# Patient Record
Sex: Male | Born: 1990 | Race: Black or African American | Hispanic: No | Marital: Single | Smoking: Current every day smoker
Health system: Southern US, Community
[De-identification: ages and names within clinical notes are randomized; demographics above are authoritative.]

## PROBLEM LIST (undated history)

## (undated) HISTORY — PX: ELBOW SURGERY: SHX618

---
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
## 2008-10-01 ENCOUNTER — Emergency Department (HOSPITAL_COMMUNITY): Admission: EM | Admit: 2008-10-01 | Discharge: 2008-10-01 | Payer: Self-pay | Admitting: Emergency Medicine

## 2008-10-10 ENCOUNTER — Emergency Department (HOSPITAL_COMMUNITY): Admission: EM | Admit: 2008-10-10 | Discharge: 2008-10-10 | Payer: Self-pay | Admitting: Emergency Medicine

## 2009-10-14 ENCOUNTER — Emergency Department (HOSPITAL_COMMUNITY): Admission: EM | Admit: 2009-10-14 | Discharge: 2009-10-14 | Payer: Self-pay | Admitting: Emergency Medicine

## 2009-10-21 ENCOUNTER — Ambulatory Visit (HOSPITAL_COMMUNITY): Admission: RE | Admit: 2009-10-21 | Discharge: 2009-10-21 | Payer: Self-pay | Admitting: Orthopedic Surgery

## 2009-11-13 ENCOUNTER — Encounter: Admission: RE | Admit: 2009-11-13 | Discharge: 2010-01-20 | Payer: Self-pay | Admitting: Orthopedic Surgery

## 2010-09-23 LAB — CBC
HCT: 43.8 % (ref 39.0–52.0)
Hemoglobin: 15 g/dL (ref 13.0–17.0)
MCHC: 34.2 g/dL (ref 30.0–36.0)
MCV: 93.7 fL (ref 78.0–100.0)
Platelets: 278 10*3/uL (ref 150–400)
RBC: 4.68 MIL/uL (ref 4.22–5.81)
RDW: 13.2 % (ref 11.5–15.5)
WBC: 9.4 10*3/uL (ref 4.0–10.5)

## 2010-09-23 LAB — BASIC METABOLIC PANEL
BUN: 12 mg/dL (ref 6–23)
CO2: 25 mEq/L (ref 19–32)
Calcium: 9.4 mg/dL (ref 8.4–10.5)
Chloride: 105 mEq/L (ref 96–112)
Creatinine, Ser: 1 mg/dL (ref 0.4–1.5)
GFR calc Af Amer: >60 mL/min (ref >60)
GFR calc non Af Amer: >60 mL/min (ref >60)
Glucose, Bld: 105 mg/dL — ABNORMAL HIGH (ref 70–99)
Potassium: 4.1 mEq/L (ref 3.5–5.1)
Sodium: 135 mEq/L (ref 135–145)

## 2011-09-20 ENCOUNTER — Emergency Department (HOSPITAL_COMMUNITY)
Admission: EM | Admit: 2011-09-20 | Discharge: 2011-09-20 | Disposition: A | Discharge status: Home / Self Care | Payer: Self-pay | Attending: Emergency Medicine | Admitting: Emergency Medicine

## 2011-09-20 ENCOUNTER — Emergency Department (HOSPITAL_COMMUNITY): Payer: Self-pay

## 2011-09-20 ENCOUNTER — Encounter (HOSPITAL_COMMUNITY): Payer: Self-pay | Admitting: *Deleted

## 2011-09-20 DIAGNOSIS — IMO0002 Reserved for concepts with insufficient information to code with codable children: Secondary | ICD-10-CM | POA: Insufficient documentation

## 2011-09-20 DIAGNOSIS — M25569 Pain in unspecified knee: Secondary | ICD-10-CM | POA: Insufficient documentation

## 2011-09-20 DIAGNOSIS — X500XXA Overexertion from strenuous movement or load, initial encounter: Secondary | ICD-10-CM | POA: Insufficient documentation

## 2011-09-20 DIAGNOSIS — S8390XA Sprain of unspecified site of unspecified knee, initial encounter: Secondary | ICD-10-CM

## 2011-09-20 MED ORDER — HYDROCODONE-ACETAMINOPHEN 5-325 MG PO TABS: 1.0000 | ORAL_TABLET | ORAL | Status: AC | PRN

## 2011-09-20 NOTE — Discharge Instructions (Signed)
Your x-rays do not show any broken bones today. At this time your providers are concerned for a knee sprain with possible ligament injury. You have been given crutches and a brace to where to help with your pain and swelling and help with healing. You should use rest, ice, compression and elevation. Please followup with orthopedic specialist next week for continued evaluation and treatment. You may also followup with your primary care provider.   Knee Sprain You have a knee sprain. Sprains are painful injuries to the joints. A sprain is a partial or complete tearing of ligaments. Ligaments are tough, fibrous tissues that hold bones together at the joints. A strain (sprain) has occurred when a ligament is stretched or damaged. This injury may take several weeks to heal. This is often the same length of time as a bone fracture (break in bone) takes to heal. Even though a fracture (bone break) may not have occurred, the recovery times may be similar. HOME CARE INSTRUCTIONS   Rest the injured area for as long as directed by your caregiver. Then slowly start using the joint as directed by your caregiver and as the pain allows. Use crutches as directed. If the knee was splinted or casted, continue use and care as directed. If an ace bandage has been applied today, it should be removed and reapplied every 3 to 4 hours. It should not be applied tightly, but firmly enough to keep swelling down. Watch toes and feet for swelling, bluish discoloration, coldness, numbness or excessive pain. If any of these symptoms occur, remove the ace bandage and reapply more loosely.If these symptoms persist, seek medical attention.   For the first 24 hours, lie down. Keep the injured extremity elevated on two pillows.   Apply ice to the injured area for 15 to 20 minutes every couple hours. Repeat this 3 to 4 times per day for the first 48 hours. Put the ice in a plastic bag and place a towel between the bag of ice and your skin.     Wear any splinting, casting, or elastic bandage applications as instructed.   Only take over-the-counter or prescription medicines for pain, discomfort, or fever as directed by your caregiver. Do not use aspirin immediately after the injury unless instructed by your caregiver. Aspirin can cause increased bleeding and bruising of the tissues.   If you were given crutches, continue to use them as instructed. Do not resume weight bearing on the affected extremity until instructed.  Persistent pain and inability to use the injured area as directed for more than 2 to 3 days are warning signs. If this happens you should see a caregiver for a follow-up visit as soon as possible. Initially, a hairline fracture (this is the same as a broken bone) may not be evident on x-rays. Persistent pain and swelling indicate that further evaluation, non-weight bearing (use of crutches as instructed), and/or further x-rays are indicated. X-rays may sometimes not show a small fracture until a week or ten days later. Make a follow-up appointment with your own caregiver or one to whom we have referred you. A radiologist (specialist in reading x-rays) may re-read your X-rays. Make sure you know how you are to get your x-ray results. Do not assume everything is normal if you do not hear from [place]. SEEK MEDICAL CARE IF:   Bruising, swelling, or pain increases.   You have cold or numb toes   You have continuing difficulty or pain with walking.  SEEK IMMEDIATE MEDICAL  CARE IF:   Your toes are cold, numb or blue.   The pain is not responding to medications and continues to stay the same or get worse.  MAKE SURE YOU:   Understand these instructions.   Will watch your condition.   Will get help right away if you are not doing well or get worse.  Document Released: 06/22/2005 Document Revised: 06/11/2011 Document Reviewed: 06/06/2007 [facility] Patient Information 2012 [place], [place].    RESOURCE GUIDE  Dental  Problems  Patients with Medicaid: [facility] [phone] [address]                                           [phone] [employer] Phone:  [phone]                                                  Phone:  [phone]  If unable to pay or uninsured, contact:  [facility] or [facility]. to become qualified for the adult dental clinic.  Chronic Pain Problems Contact [facility]  [phone] Patients need to be referred by their primary care doctor.  Insufficient Money for Medicine Contact [employer] Way:  call "211" or [facility] 873-546-1560.  No Primary Care Doctor Call [facility]  351-841-2529 Other agencies that provide inexpensive medical care    [facility]  [phone]    [facility]  [phone]    [facility]  [phone]    [facility]  [phone]    [facility]  787-724-8506    [facility]  [phone]  Psychological Services [facility] Behavioral [facility]  [phone] [facility] Services  [phone] [facility] Mental Health   [phone] (emergency services [phone])  Substance Abuse Resources [facility]  [phone] [facility] [phone] [facility] [place] 331-552-7530 Floydene Flock ([phone] Residential & Outpatient Substance Abuse Program  [phone]  Abuse/Neglect [facility] Child Abuse Hotline (801)002-3091 [facility] Child Abuse Hotline 575-353-9814 (After Hours)  Emergency Shelter [facility] (319) 643-9861  Maternity Homes Room at the [place] [phone] [place] Alert Services 7805486023  MRSA Hotline #:   563 093 9133    [facility] Resources  [facility]     [employer]                          [facility] Dept. [address] [place]                       [address]      [address]    Patrecia Pace  [facility] Phone:  [phone]                                   Phone:  [phone]                 Phone:  [place] Phone:  [place] [phone] (After Hours)

## 2011-09-20 NOTE — ED Notes (Signed)
C/o right knee pain after playing basketball last night
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
## 2011-09-20 NOTE — ED Notes (Signed)
Pt reports acute onset of rt knee pain s/p playing basketball yesterday evening. Pt w/o any gross deformities at present. Pt in no acute distress

## 2011-09-20 NOTE — ED Notes (Signed)
[employer] paged.

## 2011-09-20 NOTE — ED Notes (Signed)
Rx given x1 D/c instructions reviewed w/ pt - pt denies any further questions or concerns at present.   

## 2011-09-22 NOTE — ED Provider Notes (Signed)
Medical screening examination/treatment/procedure(s) were performed by non-physician practitioner and as supervising physician I was immediately available for consultation/collaboration.  Umaiza Matusik Smitty Cords, MD 09/22/11 0200

## 2012-08-24 ENCOUNTER — Emergency Department (HOSPITAL_COMMUNITY): Payer: Self-pay

## 2012-08-24 ENCOUNTER — Encounter (HOSPITAL_COMMUNITY): Payer: Self-pay | Admitting: Emergency Medicine

## 2012-08-24 ENCOUNTER — Emergency Department (HOSPITAL_COMMUNITY)
Admission: EM | Admit: 2012-08-24 | Discharge: 2012-08-24 | Disposition: A | Discharge status: Home / Self Care | Payer: Self-pay | Attending: Emergency Medicine | Admitting: Emergency Medicine

## 2012-08-24 DIAGNOSIS — Z791 Long term (current) use of non-steroidal anti-inflammatories (NSAID): Secondary | ICD-10-CM | POA: Insufficient documentation

## 2012-08-24 DIAGNOSIS — IMO0002 Reserved for concepts with insufficient information to code with codable children: Secondary | ICD-10-CM | POA: Insufficient documentation

## 2012-08-24 DIAGNOSIS — Y9367 Activity, basketball: Secondary | ICD-10-CM | POA: Insufficient documentation

## 2012-08-24 DIAGNOSIS — Y9239 Other specified sports and athletic area as the place of occurrence of the external cause: Secondary | ICD-10-CM | POA: Insufficient documentation

## 2012-08-24 DIAGNOSIS — S86911A Strain of unspecified muscle(s) and tendon(s) at lower leg level, right leg, initial encounter: Secondary | ICD-10-CM

## 2012-08-24 DIAGNOSIS — R296 Repeated falls: Secondary | ICD-10-CM | POA: Insufficient documentation

## 2012-08-24 DIAGNOSIS — Z9889 Other specified postprocedural states: Secondary | ICD-10-CM | POA: Insufficient documentation

## 2012-08-24 DIAGNOSIS — Z87828 Personal history of other (healed) physical injury and trauma: Secondary | ICD-10-CM | POA: Insufficient documentation

## 2012-08-24 MED ORDER — KETOROLAC TROMETHAMINE 10 MG PO TABS
10.0000 mg | ORAL_TABLET | ORAL | Status: AC
  Administered 2012-08-24: 10 mg via ORAL
  Filled 2012-08-24: qty 1

## 2012-08-24 MED ORDER — DICLOFENAC SODIUM 75 MG PO TBEC: 75.0000 mg | DELAYED_RELEASE_TABLET | Freq: Two times a day (BID) | ORAL | Status: AC

## 2012-08-24 NOTE — ED Provider Notes (Signed)
History  This chart was scribed for non-physician practitioner working with [name], MD by Ardeen Jourdain, ED Scribe. This patient was seen in room TR09C/TR09C and the patient's care was started at 2018.  CSN: [id number]  Arrival date & time 08/24/12  Troy Dickerson   First MD Initiated Contact with Patient 08/24/12 2018      Chief Complaint  Patient presents with  . Knee Pain    Patient is a 22 y.o. male presenting with knee pain. The history is provided by the patient. No language interpreter was used.  Knee Pain Location:  Knee Injury: yes   Mechanism of injury: fall   Fall:    Fall occurred:  Recreating/playing   Impact surface:  [employer]ed forces training and education officer of impact:  Feet   Entrapped after fall: no   Knee location:  R knee Pain details:    Quality:  Sharp and throbbing   Radiates to:  Does not radiate   Severity:  Moderate   Onset quality:  Sudden   Timing:  Constant   Progression:  Worsening Chronicity:  Recurrent Dislocation: no   Foreign body present:  No foreign bodies Prior injury to area:  Yes Relieved by:  None tried Worsened by:  Bearing weight, adduction and abduction Ineffective treatments:  None tried Associated symptoms: no back pain, no decreased ROM, no fatigue, no fever, no itching, no muscle weakness, no neck pain, no numbness, no stiffness, no swelling and no tingling     Troy Dickerson is a 22 y.o. male who presents to the Emergency Department complaining of right knee pain that began this afternoon while playing basketball. He states he "came down on it wrong." He is unable to ambulate due to the pain. He states he has a previous injury to the area, but is not sure what the injury is. He denies any surgery to the area. He denies any pertinent or chronic medical conditions.    History reviewed. No pertinent past medical history.  Past Surgical History  Procedure Laterality Date  . Elbow surgery      No family history on file.  History   Substance Use Topics  . Smoking status: Never Smoker   . Smokeless tobacco: Not on file  . Alcohol Use: No      Review of Systems  Constitutional: Negative for fever and fatigue.  HENT: Negative for neck pain.   Musculoskeletal: Negative for back pain and stiffness.       Right knee pain  Skin: Negative for itching.  All other systems reviewed and are negative.    Allergies  Review of patient's allergies indicates no known allergies.  Home Medications   Current Outpatient Rx  Name  Route  Sig  Dispense  Refill  . naproxen sodium (ANAPROX) 220 MG tablet   Oral   Take 440 mg by mouth 2 (two) times daily with a meal.           Triage Vitals: BP 134/77  Pulse 112  Temp(Src) 98.2 F (36.8 C) (Oral)  Resp 20  SpO2 97%  Physical Exam  Nursing note and vitals reviewed. Constitutional: He is oriented to person, place, and time. He appears well-developed and well-nourished. No distress.  HENT:  Head: Normocephalic and atraumatic.  Eyes: Conjunctivae and EOM are normal. Pupils are equal, round, and reactive to light.  Neck: Normal range of motion. Neck supple. No tracheal deviation present.  Cardiovascular: Normal rate.   Pulmonary/Chest: Effort normal. No respiratory distress.  Abdominal: Soft. He exhibits no distension.  Musculoskeletal: Normal range of motion. He exhibits tenderness. He exhibits no edema.  Full ROM of right hip, right knee warm but not hot, pain with attempted ROM of right knee, no effusion present, patella midline, no posterior mass, no deformity of the tib/fib area, achilles inact  Neurological: He is alert and oriented to person, place, and time.  Skin: Skin is warm and dry.  Psychiatric: He has a normal mood and affect. His behavior is normal.    ED Course  Procedures (including critical care time)  DIAGNOSTIC STUDIES: Oxygen Saturation is 97% on room air, normal by my interpretation.    COORDINATION OF CARE:  9:08 PM: Discussed  treatment plan which includes x-ray of the right knee, a knee immobilizer and pain medication with pt at bedside and pt agreed to plan.     Labs Reviewed - No data to display Dg Knee Complete 4 Views Right  08/24/2012  *RADIOLOGY REPORT*  Clinical Data: Right knee pain.  RIGHT KNEE - COMPLETE 4+ VIEW  Comparison: 09/20/2011.  Findings: There are mild stable degenerative changes mainly involving the lateral compartment.  No acute bony findings, osteochondral lesion or definite joint effusion.  IMPRESSION: Degenerative changes but no acute bony findings.   Original Report Authenticated By: Rudie Meyer, M.D.      No diagnosis found.    MDM  I have reviewed nursing notes, vital signs, and all appropriate lab and imaging results for this patient. Patient states he's been having problems with his right knee for quite some time. He has not been taken to an orthopedist to have this evaluated. Today he was playing basketball and came down on his right leg the wrong way and felt severe pain. The patient states he has had pain when attempting to walk on the leg since that time. X-ray of the right knee reveals degenerative changes being present but no fracture or dislocation or effusion.  The plan at this time is for the patient be fitted with a knee immobilizer and crutches. Patient advised to use ice and elevation for now. He is referred to Dr. Carola Frost for additional evaluation and management of this problem. Prescription for diclofenac 2 times daily given to the patient.       Kathie Dike, [place] 08/24/12 2154

## 2012-08-24 NOTE — Progress Notes (Signed)
Orthopedic Tech Progress Note Patient Details:  Troy Dickerson 1991/05/28 [id number]  Ortho Devices Type of Ortho Device: Crutches Ortho Device/Splint Location: right leg Ortho Device/Splint Interventions: Application   Nikki Dom 08/24/2012, 9:51 PM

## 2012-08-24 NOTE — Progress Notes (Signed)
Orthopedic Tech Progress Note Patient Details:  Troy Dickerson 28-Jan-1991 [id number]  Ortho Devices Type of Ortho Device: Knee Immobilizer Ortho Device/Splint Location: right leg Ortho Device/Splint Interventions: Application   Nikki Dom 08/24/2012, 9:32 PM

## 2012-08-24 NOTE — ED Notes (Signed)
Pt st's he injured right knee playing basketball earlier today.  Unable to ambulate due to pain

## 2012-08-25 NOTE — ED Provider Notes (Signed)
Medical screening examination/treatment/procedure(s) were performed by non-physician practitioner and as supervising physician I was immediately available for consultation/collaboration.   [name], MD 08/25/12 [phone]

## 2015-07-23 ENCOUNTER — Emergency Department (HOSPITAL_COMMUNITY)
Admission: EM | Admit: 2015-07-23 | Discharge: 2015-07-23 | Disposition: A | Discharge status: Home / Self Care | Payer: Self-pay | Attending: Emergency Medicine | Admitting: Emergency Medicine

## 2015-07-23 ENCOUNTER — Encounter (HOSPITAL_COMMUNITY): Payer: Self-pay | Admitting: Emergency Medicine

## 2015-07-23 DIAGNOSIS — F17200 Nicotine dependence, unspecified, uncomplicated: Secondary | ICD-10-CM | POA: Insufficient documentation

## 2015-07-23 DIAGNOSIS — Z79899 Other long term (current) drug therapy: Secondary | ICD-10-CM | POA: Insufficient documentation

## 2015-07-23 DIAGNOSIS — Z791 Long term (current) use of non-steroidal anti-inflammatories (NSAID): Secondary | ICD-10-CM | POA: Insufficient documentation

## 2015-07-23 DIAGNOSIS — L720 Epidermal cyst: Secondary | ICD-10-CM | POA: Insufficient documentation

## 2015-07-23 MED ORDER — LIDOCAINE HCL (PF) 1 % IJ SOLN
5.0000 mL | Freq: Once | INTRAMUSCULAR | Status: AC
  Administered 2015-07-23: 5 mL
  Filled 2015-07-23: qty 5

## 2015-07-23 NOTE — ED Notes (Signed)
Pt sts abscess on back of his neck x 3 days that is painful

## 2015-07-23 NOTE — ED Provider Notes (Signed)
CSN: [id number]     Arrival date & time 07/23/15  1236 History  By signing my name below, I, Troy Dickerson, attest that this documentation has been prepared under the direction and in the presence of Kerrie Buffalo, NP  Electronically Signed: Jarvis Dickerson, ED Scribe. 07/24/2015. 12:13 AM.    Chief Complaint  Patient presents with  . Abscess   Patient is a 25 y.o. male presenting with abscess. The history is provided by the patient. No language interpreter was used.  Abscess Location:  Head/neck Size:  5 cm Abscess quality: painful and redness   Abscess quality: not draining   Red streaking: no   Duration:  3 days Progression:  Unchanged Chronicity:  New Context: not diabetes and not immunosuppression   Relieved by:  None tried Worsened by:  Nothing tried Ineffective treatments:  None tried Associated symptoms: no fever, no headaches, no nausea and no vomiting   Risk factors: no hx of MRSA and no prior abscess     HPI Comments: Troy Dickerson is a 25 y.o. male who presents to the Emergency Department complaining of a large, painful, swollen area to the back of his neck for 3 days. Pt states movement of his neck exacerbates the pain.  He has not tried any treatments prior to arrival. He denies any h/o abscesses in the past. He denies any drainage from the area. Pt denies any fever, chills, or other associated symptoms.    History reviewed. No pertinent past medical history. Past Surgical History  Procedure Laterality Date  . Elbow surgery     History reviewed. No pertinent family history. Social History  Substance Use Topics  . Smoking status: Current Every Day Smoker  . Smokeless tobacco: None  . Alcohol Use: No    Review of Systems  Constitutional: Negative for fever.  Gastrointestinal: Negative for nausea and vomiting.  Skin:       Abscess to back of neck  Neurological: Negative for headaches.  All other systems reviewed and are negative.     Allergies   Review of patient's allergies indicates no known allergies.  Home Medications   Prior to Admission medications   Medication Sig Start Date End Date Taking? Authorizing Provider  diclofenac (VOLTAREN) 75 MG EC tablet Take 1 tablet (75 mg total) by mouth 2 (two) times daily. 08/24/12   Ivery Quale, PA-C  naproxen sodium (ANAPROX) 220 MG tablet Take 440 mg by mouth 2 (two) times daily with a meal.    Historical Provider, MD   BP 162/76 mmHg  Pulse 83  Temp(Src) 98.4 F (36.9 C) (Oral)  Resp 20  SpO2 98% Physical Exam  Constitutional: He is oriented to person, place, and time. He appears well-developed and well-nourished.  Eyes: EOM are normal.  Neck: Neck supple.  Pulmonary/Chest: Effort normal.  Abdominal: Soft. There is no tenderness.  Musculoskeletal: Normal range of motion.  Neurological: He is alert and oriented to person, place, and time. No cranial nerve deficit.  Skin: Skin is warm and dry.  5cm area to posterior aspect of the neck  Nursing note and vitals reviewed.   ED Course  Procedures (including critical care time)  DIAGNOSTIC STUDIES: Oxygen Saturation is 98% on RA, normal by my interpretation.    COORDINATION OF CARE: 2:07 PM- Will consult with Dr. Clydene Pugh due to location or abscess to determine if I&D or referral is appropriate.  Pt advised of plan for treatment and pt agrees.  2:15 pm - INCISION AND DRAINAGE  PROCEDURE NOTE: Patient identification was confirmed and verbal consent was obtained. This procedure was performed by Kerrie Buffalo, NP at 2:15 pm. Site: posterior aspect of neck Sterile procedures observed Needle size: 25 Anesthetic used (type and amt): 3cc of 1% lido w/o epi Blade size: 11 Drainage: large amount of mucus fluid Complexity: Complex Packing used: none Site anesthetized, incision made over site, wound drained and explored loculations, rinsed with copious amounts of normal saline,covered with dry, sterile dressing.  Pt tolerated  procedure well without complications.  Instructions for care discussed verbally and pt provided with additional written instructions for homecare and f/u.   MDM  25 y.o. male with tender swollen area to the posterior aspect of the neck stable for d/c without fever or meningeal signs and does not appear toxic. Discussed with the patient and all questioned fully answered. He will return if any problems arise. Referral to general surgery given to the patient if symptoms return.   Final diagnoses:  Epidermal cyst of neck   I personally performed the services described in this documentation, which was scribed in my presence. The recorded information has been reviewed and is accurate.     [address] [place], NP 07/24/15 1914  Lyndal Pulley, MD 07/24/15 [phone]

## 2015-07-23 NOTE — ED Notes (Signed)
NP at bedside.

## 2015-07-23 NOTE — Discharge Instructions (Signed)
The type of cyst you have may return even after we drained it today. I am giving you the name of a [facility] to follow up with if it the fluid fills back up in the area. They may need to do more extensive surgical procedure so the area will not re occur.

## 2017-11-18 ENCOUNTER — Encounter (HOSPITAL_COMMUNITY): Payer: Self-pay | Admitting: Emergency Medicine

## 2017-11-18 ENCOUNTER — Other Ambulatory Visit: Payer: Self-pay

## 2017-11-18 ENCOUNTER — Ambulatory Visit (HOSPITAL_COMMUNITY)
Admission: EM | Admit: 2017-11-18 | Discharge: 2017-11-18 | Disposition: A | Discharge status: Home / Self Care | Payer: Self-pay | Attending: Family Medicine | Admitting: Family Medicine

## 2017-11-18 DIAGNOSIS — Z113 Encounter for screening for infections with a predominantly sexual mode of transmission: Secondary | ICD-10-CM

## 2017-11-18 DIAGNOSIS — Z79899 Other long term (current) drug therapy: Secondary | ICD-10-CM | POA: Insufficient documentation

## 2017-11-18 DIAGNOSIS — F17210 Nicotine dependence, cigarettes, uncomplicated: Secondary | ICD-10-CM | POA: Insufficient documentation

## 2017-11-18 DIAGNOSIS — Z202 Contact with and (suspected) exposure to infections with a predominantly sexual mode of transmission: Secondary | ICD-10-CM | POA: Insufficient documentation

## 2017-11-18 MED ORDER — CEFTRIAXONE SODIUM 250 MG IJ SOLR
INTRAMUSCULAR | Status: AC
  Filled 2017-11-18: qty 250

## 2017-11-18 MED ORDER — CEFTRIAXONE SODIUM 250 MG IJ SOLR
250.0000 mg | Freq: Once | INTRAMUSCULAR | Status: AC
  Administered 2017-11-18: 250 mg via INTRAMUSCULAR

## 2017-11-18 MED ORDER — AZITHROMYCIN 250 MG PO TABS
ORAL_TABLET | ORAL | Status: AC
  Filled 2017-11-18: qty 4

## 2017-11-18 MED ORDER — AZITHROMYCIN 250 MG PO TABS
1000.0000 mg | ORAL_TABLET | Freq: Once | ORAL | Status: AC
  Administered 2017-11-18: 1000 mg via ORAL

## 2017-11-18 NOTE — ED Provider Notes (Signed)
[facility]   [id number] 11/18/17 Arrival Time: 1533   SUBJECTIVE:  Troy Dickerson is a 27 y.o. male who presents requesting STI screening.  Last unprotected sexual encounter 2 weeks ago.  Patient states girlfriend tested positive for chlamydia 2 days ago.  Sexually active with 2 partners females.  Currently asymptomatic.  Denies fever, chills, nausea, vomiting, abdominal pain, urinary symptoms, penile discharge, lesions or rashes.    No LMP for male patient.  ROS: As per HPI.  History reviewed. No pertinent past medical history. Past Surgical History:  Procedure Laterality Date  . ELBOW SURGERY     No Known Allergies No current facility-administered medications on file prior to encounter.    Current Outpatient Medications on File Prior to Encounter  Medication Sig Dispense Refill  . diclofenac (VOLTAREN) 75 MG EC tablet Take 1 tablet (75 mg total) by mouth 2 (two) times daily. 12 tablet 0  . naproxen sodium (ANAPROX) 220 MG tablet Take 440 mg by mouth 2 (two) times daily with a meal.      Social History   Socioeconomic History  . Marital status: Single    Spouse name: Not on file  . Number of children: Not on file  . Years of education: Not on file  . Highest education level: Not on file  Occupational History  . Not on file  Social Needs  . Financial resource strain: Not on file  . Food insecurity:    Worry: Not on file    Inability: Not on file  . Transportation needs:    Medical: Not on file    Non-medical: Not on file  Tobacco Use  . Smoking status: Current Every Day Smoker    Packs/day: 0.10    Types: Cigarettes  . Smokeless tobacco: Never Used  Substance and Sexual Activity  . Alcohol use: No  . Drug use: No  . Sexual activity: Not on file  Lifestyle  . Physical activity:    Days per week: Not on file    Minutes per session: Not on file  . Stress: Not on file  Relationships  . Social connections:    Talks on phone: Not on file    Gets  together: Not on file    Attends religious service: Not on file    Active member of club or organization: Not on file    Attends meetings of clubs or organizations: Not on file    Relationship status: Not on file  . Intimate partner violence:    Fear of current or ex partner: Not on file    Emotionally abused: Not on file    Physically abused: Not on file    Forced sexual activity: Not on file  Other Topics Concern  . Not on file  Social History Narrative  . Not on file   Family History  Problem Relation Age of Onset  . Healthy Mother   . Healthy Father     OBJECTIVE:  Vitals:   11/18/17 1602  BP: 138/80  Pulse: 92  Temp: 98.2 F (36.8 C)  TempSrc: Oral  SpO2: 94%     General appearance: alert, cooperative, appears stated age and no distress Throat: lips, mucosa, and tongue normal; teeth and gums normal Lungs: CTA bilaterally without adventitious breath sounds Heart: regular rate and rhythm.  Radial pulses 2+ symmetrical bilaterally Back: no CVA tenderness Skin: warm and dry Psychological:  Alert and cooperative. Normal mood and affect.   Labs Reviewed  URINE CYTOLOGY ANCILLARY  ONLY    ASSESSMENT & PLAN:  1. STD exposure     Meds ordered this encounter  Medications  . azithromycin (ZITHROMAX) tablet 1,000 mg  . cefTRIAXone (ROCEPHIN) injection 250 mg    Pending: Labs Reviewed  URINE CYTOLOGY ANCILLARY ONLY    Given rocephin  injection and azithromycin 1g in office Urine cytology sent Declines HIV/ syphilis testing today We will follow up with you regarding the results of your test If tests are positive, please abstain from sexual activity for at least 7 days and notify partners Follow up with PCP if symptoms persists Return here or go to ER if you have any new or worsening symptoms    Reviewed expectations re: course of current medical issues. Questions answered. Outlined signs and symptoms indicating need for more acute  intervention. Patient verbalized understanding. After Visit Summary given.       Rennis Harding, PA-C 11/18/17 1653

## 2017-11-18 NOTE — ED Triage Notes (Signed)
Pt states his g/f reported to him that she tested positive for chlamydia 2 days ago.  He denies any symptoms.

## 2017-11-18 NOTE — Discharge Instructions (Addendum)
Given rocephin  injection and azithromycin 1g in office Urine cytology sent Declines HIV/ syphilis testing today We will follow up with you regarding the results of your test If tests are positive, please abstain from sexual activity for at least 7 days and notify partners Follow up with PCP if symptoms persists Return here or go to ER if you have any new or worsening symptoms

## 2017-11-19 LAB — URINE CYTOLOGY ANCILLARY ONLY
Chlamydia: NEGATIVE
Neisseria Gonorrhea: NEGATIVE
Trichomonas: NEGATIVE

## 2017-11-22 ENCOUNTER — Telehealth (HOSPITAL_COMMUNITY): Payer: Self-pay

## 2017-11-22 NOTE — Telephone Encounter (Signed)
Std screening is negative, pt contacted and made aware. Answered all questions.

## 2019-12-06 ENCOUNTER — Encounter (HOSPITAL_COMMUNITY): Payer: Self-pay

## 2019-12-06 ENCOUNTER — Ambulatory Visit (HOSPITAL_COMMUNITY)
Admission: EM | Admit: 2019-12-06 | Discharge: 2019-12-06 | Disposition: A | Discharge status: Home / Self Care | Payer: Self-pay | Attending: Internal Medicine | Admitting: Internal Medicine

## 2019-12-06 ENCOUNTER — Other Ambulatory Visit: Payer: Self-pay

## 2019-12-06 DIAGNOSIS — L0231 Cutaneous abscess of buttock: Secondary | ICD-10-CM

## 2019-12-06 MED ORDER — LIDOCAINE-EPINEPHRINE (PF) 2 %-1:200000 IJ SOLN
INTRAMUSCULAR | Status: AC
  Filled 2019-12-06: qty 20

## 2019-12-06 MED ORDER — AMOXICILLIN-POT CLAVULANATE 875-125 MG PO TABS: 1.0000 | ORAL_TABLET | Freq: Two times a day (BID) | ORAL | 0 refills | Status: AC

## 2019-12-06 MED ORDER — IBUPROFEN 600 MG PO TABS: 600.0000 mg | ORAL_TABLET | Freq: Four times a day (QID) | ORAL | 0 refills | Status: DC | PRN

## 2019-12-06 NOTE — Discharge Instructions (Signed)
Please remove packing on Friday 6/4 Daily wound dressing changes If packing falls out before then leave it out. Sitz baths over next few days If pain worsens, fever or chills-return to urgent care

## 2019-12-06 NOTE — ED Triage Notes (Signed)
Pt presents with abscess on the top of crack of buttocks X 3 days.

## 2019-12-07 NOTE — ED Provider Notes (Addendum)
[place]    CSN: [id number] Arrival date & time: 12/06/19  1057      History   Chief Complaint Chief Complaint  Patient presents with  . Abscess    HPI Troy Dickerson is a 29 y.o. male comes to urgent care with painful swelling in the left gluteal cleft of 3 days duration.  Patient symptoms started insidiously and has been progressively worse.  He has had more swelling in the area.  Pain is currently severe, throbbing and exquisitely tender.  No known relieving factors.  Aggravated by walking and touching it.  No fever or chills.  No radiation of pain.Marland Kitchen   HPI  History reviewed. No pertinent past medical history.  There are no problems to display for this patient.   Past Surgical History:  Procedure Laterality Date  . ELBOW SURGERY         Home Medications    Prior to Admission medications   Medication Sig Start Date End Date Taking? Authorizing Provider  amoxicillin-clavulanate (AUGMENTIN) 875-125 MG tablet Take 1 tablet by mouth every 12 (twelve) hours. 12/06/19   Ruben Pyka, Myrene Galas, MD  diclofenac (VOLTAREN) 75 MG EC tablet Take 1 tablet (75 mg total) by mouth 2 (two) times daily. 08/24/12   Lily Kocher, PA-C  ibuprofen (ADVIL) 600 MG tablet Take 1 tablet (600 mg total) by mouth every 6 (six) hours as needed. 12/06/19   Irby Fails, Myrene Galas, MD  naproxen sodium (ANAPROX) 220 MG tablet Take 440 mg by mouth 2 (two) times daily with a meal.    [provider]    Family History Family History  Problem Relation Age of Onset  . Healthy Mother   . Healthy Father     Social History Social History   Tobacco Use  . Smoking status: Current Every Day Smoker    Packs/day: 0.10    Types: Cigarettes  . Smokeless tobacco: Never Used  Substance Use Topics  . Alcohol use: No  . Drug use: No     Allergies   Patient has no known allergies.   Review of Systems Review of Systems  Constitutional: Negative.   Gastrointestinal: Negative.     Genitourinary: Negative.   Neurological: Negative.      Physical Exam Triage Vital Signs ED Triage Vitals  Enc Vitals Group     BP 12/06/19 1110 (!) 141/95     Pulse Rate 12/06/19 1110 68     Resp 12/06/19 1110 16     Temp 12/06/19 1110 98 F (36.7 C)     Temp Source 12/06/19 1110 Oral     SpO2 12/06/19 1110 100 %     Weight --      Height --      Head Circumference --      Peak Flow --      Pain Score 12/06/19 1112 7     Pain Loc --      Pain Edu? --      Excl. in [place]? --    No data found.  Updated Vital Signs BP (!) 141/95 (BP Location: Left Arm)   Pulse 68   Temp 98 F (36.7 C) (Oral)   Resp 16   SpO2 100%   Visual Acuity Right Eye Distance:   Left Eye Distance:   Bilateral Distance:    Right Eye Near:   Left Eye Near:    Bilateral Near:     Physical Exam Vitals and nursing note reviewed.  Constitutional:  General: He is in acute distress.  Cardiovascular:     Rate and Rhythm: Normal rate and regular rhythm.  Abdominal:     General: Bowel sounds are normal.     Palpations: Abdomen is soft.  Musculoskeletal:        General: No swelling or signs of injury. Normal range of motion.     Right lower leg: No edema.  Skin:    General: Skin is warm.     Capillary Refill: Capillary refill takes less than 2 seconds.     Findings: Lesion present.     Comments: Tender, fluctuant gluteal cleft masses.  Measures about 1.5 inches in the longest diameter.  No erythema.  Neurological:     General: No focal deficit present.     Mental Status: He is alert.      UC Treatments / Results  Labs (all labs ordered are listed, but only abnormal results are displayed) Labs Reviewed - No data to display  EKG   Radiology No results found.  Procedures Incision and Drainage  Date/Time: 12/07/2019 10:40 AM Performed by: Merrilee Jansky, MD Authorized by: Merrilee Jansky, MD   Consent:    Consent obtained:  Verbal   Consent given by:  Patient   Risks  discussed:  Bleeding and incomplete drainage   Alternatives discussed:  No treatment Location:    Type:  Abscess   Location:  Anogenital   Anogenital location:  Gluteal cleft Pre-procedure details:    Skin preparation:  Betadine Anesthesia (see MAR for exact dosages):    Anesthesia method:  Local infiltration   Local anesthetic:  Lidocaine 2% WITH epi Procedure type:    Complexity:  Complex Procedure details:    Incision types:  Single straight   Incision depth:  Subcutaneous   Scalpel blade:  11   Wound management:  Probed and deloculated   Drainage:  Purulent   Drainage amount:  Moderate   Packing materials:  1/4 in iodoform gauze   Amount 1/4" iodoform:  5 inches Post-procedure details:    Patient tolerance of procedure:  Tolerated well, no immediate complications   (including critical care time)  Medications Ordered in UC Medications - No data to display  Initial Impression / Assessment and Plan / UC Course  I have reviewed the triage vital signs and the nursing notes.  Pertinent labs & imaging results that were available during my care of the patient were reviewed by me and considered in my medical decision making (see chart for details).     1.  Gluteal cleft abscess: Incision and drainage completed Augmentin 1 tablet twice daily for 7 days Ibuprofen 600 mg as needed for pain Return precautions given Sitz baths, warm soaks, daily wound dressing changes with bacitracin ointment.  If patient's symptoms develops persistent pain, increasing drainage, redness-patient advised to return to urgent care to be reevaluated. Final Clinical Impressions(s) / UC Diagnoses   Final diagnoses:  Abscess, gluteal, left     Discharge Instructions     Please remove packing on Friday 6/4 Daily wound dressing changes If packing falls out before then leave it out. Sitz baths over next few days If pain worsens, fever or chills-return to urgent care    ED Prescriptions     Medication Sig Dispense Auth. Provider   amoxicillin-clavulanate (AUGMENTIN) 875-125 MG tablet Take 1 tablet by mouth every 12 (twelve) hours. 14 tablet Jessyca Sloan, Britta Mccreedy, MD   ibuprofen (ADVIL) 600 MG tablet Take 1 tablet (600 mg  total) by mouth every 6 (six) hours as needed. 30 tablet Azazel Franze, Britta Mccreedy, MD     PDMP not reviewed this encounter.   Merrilee Jansky, MD 12/07/19 1039    Merrilee Jansky, MD 12/07/19 1041

## 2019-12-22 ENCOUNTER — Ambulatory Visit (HOSPITAL_COMMUNITY)
Admission: EM | Admit: 2019-12-22 | Discharge: 2019-12-22 | Disposition: A | Discharge status: Home / Self Care | Payer: Self-pay | Attending: Physician Assistant | Admitting: Physician Assistant

## 2019-12-22 ENCOUNTER — Other Ambulatory Visit: Payer: Self-pay

## 2019-12-22 ENCOUNTER — Encounter (HOSPITAL_COMMUNITY): Payer: Self-pay

## 2019-12-22 DIAGNOSIS — L0211 Cutaneous abscess of neck: Secondary | ICD-10-CM

## 2019-12-22 MED ORDER — DOXYCYCLINE HYCLATE 100 MG PO CAPS: 100.0000 mg | ORAL_CAPSULE | Freq: Two times a day (BID) | ORAL | 0 refills | Status: AC

## 2019-12-22 NOTE — Discharge Instructions (Signed)
Take the doxycycline 2 times a day for 10 days - drink plenty of water when taking this  Keep packing in for 24-48 hours, if this falls out tomorrow this is fine. Clean and shower daily, massage gently to express any remaining discharge  Return in 48 hours if lots of drainage or regrowth, other wise monitor

## 2019-12-22 NOTE — ED Triage Notes (Signed)
Pt presents with abscess on back of neck X 2 days.

## 2019-12-22 NOTE — ED Provider Notes (Signed)
[facility]    CSN: [id number] Arrival date & time: 12/22/19  1352      History   Chief Complaint Chief Complaint  Patient presents with  . Abscess    HPI Troy Dickerson is a 29 y.o. male.   Patient presents for evaluation of abscess on his neck.  He reports this arose over the last 2 days.  He reports a lot of pain.  Denies fever or chills.  He reports he has had something similar in the past several years ago that he had drained.  He is not sure if everything was removed at that time.  He has not had any growth since then until last 2 days.  Nothing is drained in the last 2 days     History reviewed. No pertinent past medical history.  There are no problems to display for this patient.   Past Surgical History:  Procedure Laterality Date  . ELBOW SURGERY         Home Medications    Prior to Admission medications   Medication Sig Start Date End Date Taking? Authorizing Provider  amoxicillin-clavulanate (AUGMENTIN) 875-125 MG tablet Take 1 tablet by mouth every 12 (twelve) hours. 12/06/19   Lamptey, Britta Mccreedy, MD  diclofenac (VOLTAREN) 75 MG EC tablet Take 1 tablet (75 mg total) by mouth 2 (two) times daily. 08/24/12   Ivery Quale, PA-C  doxycycline (VIBRAMYCIN) 100 MG capsule Take 1 capsule (100 mg total) by mouth 2 (two) times daily. 12/22/19   Hamsa Laurich, Veryl Speak, PA-C  ibuprofen (ADVIL) 600 MG tablet Take 1 tablet (600 mg total) by mouth every 6 (six) hours as needed. 12/06/19   Lamptey, Britta Mccreedy, MD  naproxen sodium (ANAPROX) 220 MG tablet Take 440 mg by mouth 2 (two) times daily with a meal.    [provider]    Family History Family History  Problem Relation Age of Onset  . Healthy Mother   . Healthy Father     Social History Social History   Tobacco Use  . Smoking status: Current Every Day Smoker    Packs/day: 0.10    Types: Cigarettes  . Smokeless tobacco: Never Used  Vaping Use  . Vaping Use: Never used  Substance Use Topics  .  Alcohol use: No  . Drug use: No     Allergies   Patient has no known allergies.   Review of Systems Review of Systems   Physical Exam Triage Vital Signs ED Triage Vitals  Enc Vitals Group     BP 12/22/19 1505 138/80     Pulse Rate 12/22/19 1505 93     Resp 12/22/19 1505 18     Temp 12/22/19 1505 98 F (36.7 C)     Temp Source 12/22/19 1505 Oral     SpO2 12/22/19 1505 96 %     Weight --      Height --      Head Circumference --      Peak Flow --      Pain Score 12/22/19 1507 8     Pain Loc --      Pain Edu? --      Excl. in [place]? --    No data found.  Updated Vital Signs BP 138/80 (BP Location: Right Arm)   Pulse 93   Temp 98 F (36.7 C) (Oral)   Resp 18   SpO2 96%   Visual Acuity Right Eye Distance:   Left Eye Distance:  Bilateral Distance:    Right Eye Near:   Left Eye Near:    Bilateral Near:     Physical Exam Vitals and nursing note reviewed.  Constitutional:      Appearance: Normal appearance.  Neck:     Comments: Approximately 1.5 x 3 cm abscess/furuncle at the nape of the neck at the hairline.  Fluctuance appreciated.  Very tender.  No significant surrounding cellulitis Neurological:     Mental Status: He is alert.      UC Treatments / Results  Labs (all labs ordered are listed, but only abnormal results are displayed) Labs Reviewed - No data to display  EKG   Radiology No results found.  Procedures Incision and Drainage  Date/Time: 12/22/2019 4:16 PM Performed by: Purnell Shoemaker, PA-C Authorized by: Purnell Shoemaker, PA-C   Consent:    Consent obtained:  Verbal   Consent given by:  Patient   Risks discussed:  Bleeding, incomplete drainage and pain   Alternatives discussed:  Alternative treatment Location:    Type:  Abscess   Size:  1.5 cm x 3 cm   Location:  Neck (Upper posterior neck at the hairline) Pre-procedure details:    Skin preparation:  Antiseptic wash Anesthesia (see MAR for exact dosages):    Anesthesia method:   Local infiltration   Local anesthetic:  Lidocaine 2% WITH epi Procedure details:    Incision types:  Single straight   Scalpel blade:  11   Wound management:  Probed and deloculated   Drainage:  Purulent and bloody   Drainage amount:  Moderate   Packing materials:  1/4 in gauze   Amount 1/4":  2 cm Post-procedure details:    Patient tolerance of procedure:  Tolerated well, no immediate complications   (including critical care time)  Medications Ordered in UC Medications - No data to display  Initial Impression / Assessment and Plan / UC Course  I have reviewed the triage vital signs and the nursing notes.  Pertinent labs & imaging results that were available during my care of the patient were reviewed by me and considered in my medical decision making (see chart for details).     #Abscess Patient is a 29 year old with abscess of the skin of the upper neck.  Per chart review does appear he had a epidermal infected cyst in 2017.  Likely has had some cyst growth over time but has recently become infected.  Successfully drained.  Small amount of packing placed due to skin tension causing immediate wound closure and approximation.  Discussed that this should come out in 24 to 48 hours and apply gentle pressure.  Placed on doxycycline.  Return precautions were discussed otherwise I feel this will heal nicely.  Patient verbalized understanding plan. Final Clinical Impressions(s) / UC Diagnoses   Final diagnoses:  Abscess of skin of neck     Discharge Instructions     Take the doxycycline 2 times a day for 10 days - drink plenty of water when taking this  Keep packing in for 24-48 hours, if this falls out tomorrow this is fine. Clean and shower daily, massage gently to express any remaining discharge  Return in 48 hours if lots of drainage or regrowth, other wise monitor    ED Prescriptions    Medication Sig Dispense Auth. Provider   doxycycline (VIBRAMYCIN) 100 MG capsule  Take 1 capsule (100 mg total) by mouth 2 (two) times daily. 20 capsule Daysean Tinkham, Marguerita Beards, PA-C  PDMP not reviewed this encounter.   Hermelinda Medicus, PA-C 12/22/19 1620

## 2020-03-30 ENCOUNTER — Other Ambulatory Visit: Payer: Self-pay

## 2020-03-30 ENCOUNTER — Encounter (HOSPITAL_COMMUNITY): Payer: Self-pay

## 2020-03-30 ENCOUNTER — Ambulatory Visit (HOSPITAL_COMMUNITY)
Admission: EM | Admit: 2020-03-30 | Discharge: 2020-03-30 | Disposition: A | Discharge status: Home / Self Care | Payer: Self-pay | Attending: Emergency Medicine | Admitting: Emergency Medicine

## 2020-03-30 ENCOUNTER — Ambulatory Visit (INDEPENDENT_AMBULATORY_CARE_PROVIDER_SITE_OTHER): Payer: Self-pay

## 2020-03-30 ENCOUNTER — Other Ambulatory Visit (HOSPITAL_COMMUNITY): Payer: Self-pay

## 2020-03-30 DIAGNOSIS — M25562 Pain in left knee: Secondary | ICD-10-CM

## 2020-03-30 MED ORDER — IBUPROFEN 600 MG PO TABS: 600.0000 mg | ORAL_TABLET | Freq: Four times a day (QID) | ORAL | 0 refills | Status: AC | PRN

## 2020-03-30 NOTE — ED Provider Notes (Addendum)
[facility]    CSN: [id number] Arrival date & time: 03/30/20  1451      History   Chief Complaint Chief Complaint  Patient presents with  . Knee Injury    HPI Troy Dickerson is a 29 y.o. male.   Patient presents with bilateral knee pain, Left>Right, since falling while playing basketball today.  He reports the pain is worse with weightbearing and walking.  He denies numbness or paresthesias. He denies head injury or LOC.  The history is provided by the patient.    History reviewed. No pertinent past medical history.  There are no problems to display for this patient.   Past Surgical History:  Procedure Laterality Date  . ELBOW SURGERY         Home Medications    Prior to Admission medications   Medication Sig Start Date End Date Taking? Authorizing Provider  amoxicillin-clavulanate (AUGMENTIN) 875-125 MG tablet Take 1 tablet by mouth every 12 (twelve) hours. 12/06/19   Lamptey, Britta Mccreedy, MD  diclofenac (VOLTAREN) 75 MG EC tablet Take 1 tablet (75 mg total) by mouth 2 (two) times daily. 08/24/12   Ivery Quale, PA-C  doxycycline (VIBRAMYCIN) 100 MG capsule Take 1 capsule (100 mg total) by mouth 2 (two) times daily. 12/22/19   Darr, Veryl Speak, PA-C  ibuprofen (ADVIL) 600 MG tablet Take 1 tablet (600 mg total) by mouth every 6 (six) hours as needed. 03/30/20   Mickie Bail, NP  naproxen sodium (ANAPROX) 220 MG tablet Take 440 mg by mouth 2 (two) times daily with a meal.    [provider]    Family History Family History  Problem Relation Age of Onset  . Healthy Mother   . Healthy Father     Social History Social History   Tobacco Use  . Smoking status: Current Every Day Smoker    Packs/day: 0.10    Types: Cigarettes  . Smokeless tobacco: Never Used  Vaping Use  . Vaping Use: Never used  Substance Use Topics  . Alcohol use: No  . Drug use: No     Allergies   Patient has no known allergies.   Review of Systems Review of Systems    Constitutional: Negative for chills and fever.  HENT: Negative for ear pain and sore throat.   Eyes: Negative for pain and visual disturbance.  Respiratory: Negative for cough and shortness of breath.   Cardiovascular: Negative for chest pain and palpitations.  Gastrointestinal: Negative for abdominal pain and vomiting.  Genitourinary: Negative for dysuria and hematuria.  Musculoskeletal: Positive for arthralgias. Negative for back pain.  Skin: Negative for color change and rash.  Neurological: Negative for seizures, syncope, weakness and numbness.  All other systems reviewed and are negative.    Physical Exam Triage Vital Signs ED Triage Vitals  Enc Vitals Group     BP      Pulse      Resp      Temp      Temp src      SpO2      Weight      Height      Head Circumference      Peak Flow      Pain Score      Pain Loc      Pain Edu?      Excl. in [place]?    No data found.  Updated Vital Signs BP 133/68   Pulse 95   Temp 98.2 F (  36.8 C) (Oral)   Resp 18   Ht 6\' 1"  (1.854 m)   Wt 230 lb (104.3 kg)   SpO2 100%   BMI 30.34 kg/m   Visual Acuity Right Eye Distance:   Left Eye Distance:   Bilateral Distance:    Right Eye Near:   Left Eye Near:    Bilateral Near:     Physical Exam Vitals and nursing note reviewed.  Constitutional:      Appearance: He is well-developed.  HENT:     Head: Normocephalic and atraumatic.     Mouth/Throat:     Mouth: Mucous membranes are moist.  Eyes:     Conjunctiva/sclera: Conjunctivae normal.  Cardiovascular:     Rate and Rhythm: Normal rate and regular rhythm.     Heart sounds: No murmur heard.   Pulmonary:     Effort: Pulmonary effort is normal. No respiratory distress.     Breath sounds: Normal breath sounds.  Abdominal:     Palpations: Abdomen is soft.     Tenderness: There is no abdominal tenderness.  Musculoskeletal:        General: Swelling and tenderness present. No deformity.     Cervical back: Neck supple.      Comments: Generalized tenderness of bilateral knees. Limited ROM of left knee due to discomfort.   Skin:    General: Skin is warm and dry.     Findings: Lesion present.     Comments: Abrasions in various stages of healing on left knee.   Neurological:     General: No focal deficit present.     Mental Status: He is alert and oriented to person, place, and time.     Sensory: No sensory deficit.     Gait: Gait abnormal.     Comments: Limping gait favoring left LE.   Psychiatric:     Comments: Patient tearful throughout exam.       UC Treatments / Results  Labs (all labs ordered are listed, but only abnormal results are displayed) Labs Reviewed - No data to display  EKG   Radiology DG Knee Complete 4 Views Left  Result Date: 03/30/2020 CLINICAL DATA:  Fall while playing basketball with knee pain, initial encounter EXAM: LEFT KNEE - COMPLETE 4+ VIEW COMPARISON:  None. FINDINGS: Mild osteophytic changes are noted in the medial and lateral joint spaces. No joint effusion is seen. No bony abnormality is noted. IMPRESSION: Degenerative change without acute abnormality. Electronically Signed   By: 04/01/2020 M.D.   On: 03/30/2020 16:24    Procedures Procedures (including critical care time)  Medications Ordered in UC Medications - No data to display  Initial Impression / Assessment and Plan / UC Course  I have reviewed the triage vital signs and the nursing notes.  Pertinent labs & imaging results that were available during my care of the patient were reviewed by me and considered in my medical decision making (see chart for details).   Acute pain of the left knee.  X-ray negative for acute bony abnormality.  Treating with ibuprofen, rest, elevation, ice packs, knee immobilizer, crutches.  Instructed patient to call orthopedics to schedule an appointment for follow-up.  Patient agrees to plan of care.   Update: patient unable to tolerate knee immobilizer. Ace wrap applied instead.   Instructed to be non-weight-bearing until evaluated by Ortho.      Final Clinical Impressions(s) / UC Diagnoses   Final diagnoses:  Acute pain of left knee  Discharge Instructions     Take the ibuprofen as prescribed.  Rest and elevate your knee.  Apply ice packs 2-3 times a day for up to 20 minutes each.  Wear the knee brace and use the crutches as directed.    Schedule an appointment for follow-up with an orthopedist as soon as possible.        ED Prescriptions    Medication Sig Dispense Auth. Provider   ibuprofen (ADVIL) 600 MG tablet Take 1 tablet (600 mg total) by mouth every 6 (six) hours as needed. 30 tablet Mickie Bail, NP     I have reviewed the PDMP during this encounter.   Mickie Bail, NP 03/30/20 1637    Mickie Bail, NP 03/30/20 [phone]

## 2020-03-30 NOTE — Discharge Instructions (Signed)
Take the ibuprofen as prescribed.  Rest and elevate your knee.  Apply ice packs 2-3 times a day for up to 20 minutes each.  Wear the knee brace and use the crutches as directed.    Schedule an appointment for follow-up with an orthopedist as soon as possible.

## 2020-03-30 NOTE — ED Notes (Signed)
Patient refused to straighten leg to apply knee immobilizer.  Provider notified and ordered ace wrap.  Patient tolerated ace wrap well.

## 2020-03-30 NOTE — ED Triage Notes (Signed)
Pt states he was playing basketball and he states he got "under cutted" and injured both knees, but the left knee is more painful. Pt has 1+ swelling on left knee. Pt has a scabbed area on left knee. Pt limped to triage.

## 2020-04-16 ENCOUNTER — Other Ambulatory Visit: Payer: Self-pay | Admitting: Orthopedic Surgery

## 2020-04-16 DIAGNOSIS — M2392 Unspecified internal derangement of left knee: Secondary | ICD-10-CM

## 2020-04-16 DIAGNOSIS — M2391 Unspecified internal derangement of right knee: Secondary | ICD-10-CM

## 2020-05-05 ENCOUNTER — Other Ambulatory Visit: Payer: Self-pay

## 2020-05-09 ENCOUNTER — Ambulatory Visit
Admission: RE | Admit: 2020-05-09 | Discharge: 2020-05-09 | Disposition: A | Discharge status: Home / Self Care | Payer: 59 | Source: Ambulatory Visit | Attending: Orthopedic Surgery | Admitting: Orthopedic Surgery

## 2020-05-09 ENCOUNTER — Other Ambulatory Visit: Payer: Self-pay

## 2020-05-09 DIAGNOSIS — M2392 Unspecified internal derangement of left knee: Secondary | ICD-10-CM

## 2020-05-09 DIAGNOSIS — M2391 Unspecified internal derangement of right knee: Secondary | ICD-10-CM

## 2020-05-23 ENCOUNTER — Ambulatory Visit (INDEPENDENT_AMBULATORY_CARE_PROVIDER_SITE_OTHER): Payer: 59 | Admitting: Orthopedic Surgery

## 2020-05-23 DIAGNOSIS — M1732 Unilateral post-traumatic osteoarthritis, left knee: Secondary | ICD-10-CM | POA: Diagnosis not present

## 2020-05-23 DIAGNOSIS — M1731 Unilateral post-traumatic osteoarthritis, right knee: Secondary | ICD-10-CM

## 2020-05-23 DIAGNOSIS — M172 Bilateral post-traumatic osteoarthritis of knee: Secondary | ICD-10-CM

## 2020-05-24 ENCOUNTER — Encounter: Payer: Self-pay | Admitting: Orthopedic Surgery

## 2020-05-24 DIAGNOSIS — M1732 Unilateral post-traumatic osteoarthritis, left knee: Secondary | ICD-10-CM | POA: Diagnosis not present

## 2020-05-24 MED ORDER — BUPIVACAINE HCL 0.25 % IJ SOLN
4.0000 mL | INTRAMUSCULAR | Status: AC | PRN
  Administered 2020-05-24: 4 mL via INTRA_ARTICULAR

## 2020-05-24 MED ORDER — LIDOCAINE HCL 1 % IJ SOLN
5.0000 mL | INTRAMUSCULAR | Status: AC | PRN
  Administered 2020-05-24: 5 mL

## 2020-05-24 MED ORDER — METHYLPREDNISOLONE ACETATE 40 MG/ML IJ SUSP
40.0000 mg | INTRAMUSCULAR | Status: AC | PRN
  Administered 2020-05-24: 40 mg via INTRA_ARTICULAR

## 2020-05-24 NOTE — Progress Notes (Signed)
Office Visit Note   Patient: Troy Dickerson           Date of Birth: 13-Aug-1990           MRN: [id number] Visit Date: 05/23/2020 Requested by: No referring provider defined for this encounter. PCP: Patient, No Pcp Per  Subjective: Chief Complaint  Patient presents with   Left Knee - Pain   Right Knee - Pain    HPI: Troy Dickerson is a 29 year old patient with bilateral knee pain left worse than right.  He has had bilateral MRI scans which are reviewed.  Injured his left knee playing basketball in early October.  Currently the patient is unemployed.  He hurt his right knee years ago.  He has been in prison.  States the right knee pops and shifts but he still has been playing basketball.  Had an injury in October affecting the left knee.  Typically he does physical work but currently he is unemployed.  MRI scans are reviewed.  Right knee MRI shows severe lateral compartment arthritis with near complete erosion of articular cartilage on that side.  ACL deficiency also present.  Moderate degenerative changes present in the medial and patellofemoral compartments.  On the left knee there is evidence of inferior pole patellar arthritis with intact menisci and ligaments.  Some chondral thinning is present.  There is some edema in the fat pad consistent with patellar friction syndrome.              ROS: All systems reviewed are negative as they relate to the chief complaint within the history of present illness.  Patient denies  fevers or chills.   Assessment & Plan: Visit Diagnoses:  1. Post-traumatic osteoarthritis of both knees     Plan: Impression is bilateral knee pain.  Right knee will need knee replacement likely before the age of 41.  I do think is a great idea for him to do cutting and pivoting sports.  He is at a fairly reasonable weight.  Regarding the left knee I think there may or may not be operative pathology in the knee.  He does have some popping with range of motion primarily inferior  lateral.  No definite loose bodies or meniscal pathology which can be treated but he may have some patellofemoral extensor mechanism issues which potentially could be treated but that is about a 6040 chance of improvement in my opinion.  We talked about nonoperative and operative options today.  Communication was difficult at times but in general I think that an injection is indicated first for diagnostic and therapeutic purposes.  He was fairly functional with the knee prior to this injury.  The injury was a month ago.  Sometimes a cortisone shot compressed the reset button and get his knee feeling back to the way it was prior to the injury.  I think that would be a good first step and we can readdress potential surgical intervention for the left knee depending on his results with the injection  Follow-Up Instructions: Return in about 4 weeks (around 06/20/2020).   Orders:  No orders of the defined types were placed in this encounter.  No orders of the defined types were placed in this encounter.     Procedures: Large Joint Inj: L knee on 05/24/2020 10:39 PM Indications: diagnostic evaluation, joint swelling and pain Details: 18 G 1.5 in needle, superolateral approach  Arthrogram: No  Medications: 5 mL lidocaine 1 %; 40 mg methylPREDNISolone acetate 40 MG/ML; 4 mL  bupivacaine 0.25 % Outcome: tolerated well, no immediate complications Procedure, treatment alternatives, risks and benefits explained, specific risks discussed. Consent was given by the patient. Immediately prior to procedure a time out was called to verify the correct patient, procedure, equipment, support staff and site/side marked as required. Patient was prepped and draped in the usual sterile fashion.       Clinical Data: No additional findings.  Objective: Vital Signs: There were no vitals taken for this visit.  Physical Exam:   Constitutional: Patient appears well-developed HEENT:  Head: Normocephalic Eyes:EOM  are normal Neck: Normal range of motion Cardiovascular: Normal rate Pulmonary/chest: Effort normal Neurologic: Patient is alert Skin: Skin is warm Psychiatric: Patient has normal mood and affect    Ortho Exam: Ortho exam demonstrates ACL laxity on the right knee but no definite malalignment.  Mild effusion is present.  Collaterals are stable.  No posterior lateral rotatory instability is noted.  On the left knee he does have a little bit of popping inferior laterally with knee flexion.  Patella mobility is good without apprehension.  Extensor mechanism is intact.  No masses lymphadenopathy or skin changes noted in that left knee region.  Collaterals and cruciates are stable.  Mild effusion is present.  Specialty Comments:  No specialty comments available.  Imaging: No results found.   PMFS History: There are no problems to display for this patient.  No past medical history on file.  Family History  Problem Relation Age of Onset   Healthy Mother    Healthy Father     Past Surgical History:  Procedure Laterality Date   ELBOW SURGERY     Social History   Occupational History   Not on file  Tobacco Use   Smoking status: Current Every Day Smoker    Packs/day: 0.10    Types: Cigarettes   Smokeless tobacco: Never Used  Vaping Use   Vaping Use: Never used  Substance and Sexual Activity   Alcohol use: No   Drug use: No   Sexual activity: Not on file

## 2020-08-08 ENCOUNTER — Other Ambulatory Visit: Payer: 59

## 2020-08-08 DIAGNOSIS — Z20822 Contact with and (suspected) exposure to covid-19: Secondary | ICD-10-CM

## 2020-08-09 LAB — NOVEL CORONAVIRUS, NAA: SARS-CoV-2, NAA: NOT DETECTED

## 2020-08-09 LAB — SARS-COV-2, NAA 2 DAY TAT

## 2021-02-20 ENCOUNTER — Emergency Department (HOSPITAL_BASED_OUTPATIENT_CLINIC_OR_DEPARTMENT_OTHER)
Admission: EM | Admit: 2021-02-20 | Discharge: 2021-02-20 | Disposition: A | Discharge status: Home / Self Care | Payer: 59 | Attending: Emergency Medicine | Admitting: Emergency Medicine

## 2021-02-20 ENCOUNTER — Other Ambulatory Visit: Payer: Self-pay

## 2021-02-20 ENCOUNTER — Encounter (HOSPITAL_BASED_OUTPATIENT_CLINIC_OR_DEPARTMENT_OTHER): Payer: Self-pay

## 2021-02-20 DIAGNOSIS — L0501 Pilonidal cyst with abscess: Secondary | ICD-10-CM | POA: Insufficient documentation

## 2021-02-20 DIAGNOSIS — L0291 Cutaneous abscess, unspecified: Secondary | ICD-10-CM

## 2021-02-20 DIAGNOSIS — F17210 Nicotine dependence, cigarettes, uncomplicated: Secondary | ICD-10-CM | POA: Insufficient documentation

## 2021-02-20 MED ORDER — SULFAMETHOXAZOLE-TRIMETHOPRIM 800-160 MG PO TABS: 1.0000 | ORAL_TABLET | Freq: Two times a day (BID) | ORAL | 0 refills | Status: AC

## 2021-02-20 MED ORDER — SULFAMETHOXAZOLE-TRIMETHOPRIM 800-160 MG PO TABS
1.0000 | ORAL_TABLET | Freq: Once | ORAL | Status: AC
  Administered 2021-02-20: 1 via ORAL
  Filled 2021-02-20: qty 1

## 2021-02-20 MED ORDER — MORPHINE SULFATE 15 MG PO TABS: 7.5000 mg | ORAL_TABLET | ORAL | 0 refills | Status: AC | PRN

## 2021-02-20 MED ORDER — OXYCODONE HCL 5 MG PO TABS
5.0000 mg | ORAL_TABLET | Freq: Once | ORAL | Status: AC
  Administered 2021-02-20: 5 mg via ORAL
  Filled 2021-02-20: qty 1

## 2021-02-20 MED ORDER — LIDOCAINE-EPINEPHRINE (PF) 2 %-1:200000 IJ SOLN
10.0000 mL | Freq: Once | INTRAMUSCULAR | Status: AC
  Administered 2021-02-20: 10 mL via INTRADERMAL
  Filled 2021-02-20: qty 20

## 2021-02-20 MED ORDER — ACETAMINOPHEN 500 MG PO TABS
1000.0000 mg | ORAL_TABLET | Freq: Once | ORAL | Status: AC
  Administered 2021-02-20: 1000 mg via ORAL
  Filled 2021-02-20: qty 2

## 2021-02-20 NOTE — ED Provider Notes (Signed)
[facility] EMERGENCY DEPT Provider Note   CSN: [id number] Arrival date & time: 02/20/21  0224     History Chief Complaint  Patient presents with   Abscess    Troy Dickerson is a 30 y.o. male.  30 yo M with a chief complaints of a abscess to his buttock.  He has had these in the past and feels like this is recurred.  Going on for about 48 hours or so.  Feeling kind of hot and cold at home.  No nausea or vomiting.  The history is provided by the patient.  Illness Severity:  Moderate Onset quality:  Gradual Duration:  2 days Progression:  Worsening Chronicity:  Recurrent Associated symptoms: no abdominal pain, no chest pain, no congestion, no diarrhea, no fever, no headaches, no myalgias, no rash, no shortness of breath and no vomiting       History reviewed. No pertinent past medical history.  There are no problems to display for this patient.   Past Surgical History:  Procedure Laterality Date   ELBOW SURGERY         Family History  Problem Relation Age of Onset   Healthy Mother    Healthy Father     Social History   Tobacco Use   Smoking status: Every Day    Packs/day: 0.10    Types: Cigarettes   Smokeless tobacco: Never  Vaping Use   Vaping Use: Never used  Substance Use Topics   Alcohol use: No   Drug use: No    Home Medications Prior to Admission medications   Medication Sig Start Date End Date Taking? Authorizing Provider  morphine (MSIR) 15 MG tablet Take 0.5 tablets (7.5 mg total) by mouth every 4 (four) hours as needed for severe pain. 02/20/21  Yes Melene Plan, DO  sulfamethoxazole-trimethoprim (BACTRIM DS) 800-160 MG tablet Take 1 tablet by mouth 2 (two) times daily for 7 days. 02/20/21 02/27/21 Yes Melene Plan, DO  amoxicillin-clavulanate (AUGMENTIN) 875-125 MG tablet Take 1 tablet by mouth every 12 (twelve) hours. 12/06/19   Lamptey, Britta Mccreedy, MD  diclofenac (VOLTAREN) 75 MG EC tablet Take 1 tablet (75 mg total) by mouth 2 (two)  times daily. 08/24/12   Ivery Quale, PA-C  doxycycline (VIBRAMYCIN) 100 MG capsule Take 1 capsule (100 mg total) by mouth 2 (two) times daily. 12/22/19   Darr, Gerilyn Pilgrim, PA-C  ibuprofen (ADVIL) 600 MG tablet Take 1 tablet (600 mg total) by mouth every 6 (six) hours as needed. 03/30/20   Mickie Bail, NP  naproxen sodium (ANAPROX) 220 MG tablet Take 440 mg by mouth 2 (two) times daily with a meal.    [provider]    Allergies    Patient has no known allergies.  Review of Systems   Review of Systems  Constitutional:  Negative for chills and fever.  HENT:  Negative for congestion and facial swelling.   Eyes:  Negative for discharge and visual disturbance.  Respiratory:  Negative for shortness of breath.   Cardiovascular:  Negative for chest pain and palpitations.  Gastrointestinal:  Negative for abdominal pain, diarrhea and vomiting.  Musculoskeletal:  Negative for arthralgias and myalgias.  Skin:  Positive for color change. Negative for rash.  Neurological:  Negative for tremors, syncope and headaches.  Psychiatric/Behavioral:  Negative for confusion and dysphoric mood.    Physical Exam Updated Vital Signs BP (!) 168/84 (BP Location: Right Arm)   Pulse 92   Temp 98.2 F (36.8 C) (Oral)  Resp (!) 21   Ht 6\' 1"  (1.854 m)   Wt 104.3 kg   SpO2 100%   BMI 30.34 kg/m   Physical Exam Vitals and nursing note reviewed.  Constitutional:      Appearance: He is well-developed.  HENT:     Head: Normocephalic and atraumatic.  Eyes:     Pupils: Pupils are equal, round, and reactive to light.  Neck:     Vascular: No JVD.  Cardiovascular:     Rate and Rhythm: Normal rate and regular rhythm.     Heart sounds: No murmur heard.   No friction rub. No gallop.  Pulmonary:     Effort: No respiratory distress.     Breath sounds: No wheezing.  Abdominal:     General: There is no distension.     Tenderness: There is no abdominal tenderness. There is no guarding or rebound.   Musculoskeletal:        General: Normal range of motion.     Cervical back: Normal range of motion and neck supple.  Skin:    Coloration: Skin is not pale.     Findings: No rash.     Comments: Area of fluctuance and induration to the left upper buttock adjacent to the pilonidal region.  No extension to the rectum.  Neurological:     Mental Status: He is alert and oriented to person, place, and time.  Psychiatric:        Behavior: Behavior normal.    ED Results / Procedures / Treatments   Labs (all labs ordered are listed, but only abnormal results are displayed) Labs Reviewed - No data to display  EKG None  Radiology No results found.  Procedures . Incision and Drainage  Date/Time: 02/20/2021 3:23 AM Performed by: 02/22/2021, DO Authorized by: Melene Plan, DO   Consent:    Consent obtained:  Verbal   Consent given by:  Patient   Risks, benefits, and alternatives were discussed: yes     Risks discussed:  Bleeding, incomplete drainage and infection   Alternatives discussed:  No treatment and delayed treatment Universal protocol:    Procedure explained and questions answered to patient or proxy's satisfaction: yes     Immediately prior to procedure, a time out was called: yes     Patient identity confirmed:  Verbally with patient Location:    Type:  Abscess   Size:  Quarter   Location:  Anogenital   Anogenital location:  Pilonidal Pre-procedure details:    Skin preparation:  Chlorhexidine Sedation:    Sedation type:  None Anesthesia:    Anesthesia method:  Local infiltration   Local anesthetic:  Lidocaine 2% WITH epi Procedure type:    Complexity:  Complex Procedure details:    Ultrasound guidance: no     Needle aspiration: no     Incision types:  Single straight   Incision depth:  Subcutaneous   Wound management:  Probed and deloculated   Drainage:  Bloody and purulent   Drainage amount:  Copious   Wound treatment:  Wound left open   Packing materials:   None Post-procedure details:    Procedure completion:  Tolerated well, no immediate complications   Medications Ordered in ED Medications  lidocaine-EPINEPHrine (XYLOCAINE W/EPI) 2 %-1:200000 (PF) injection 10 mL (10 mLs Intradermal Given by Other 02/20/21 0243)  acetaminophen (TYLENOL) tablet 1,000 mg (1,000 mg Oral Given 02/20/21 0243)  oxyCODONE (Oxy IR/ROXICODONE) immediate release tablet 5 mg (5 mg Oral Given 02/20/21 0243)  sulfamethoxazole-trimethoprim (  BACTRIM DS) 800-160 MG per tablet 1 tablet (1 tablet Oral Given 02/20/21 0318)    ED Course  I have reviewed the triage vital signs and the nursing notes.  Pertinent labs & imaging results that were available during my care of the patient were reviewed by me and considered in my medical decision making (see chart for details).    MDM Rules/Calculators/A&P                           30 yo M with a chief complaints of an abscess.  Will I&D at bedside.  In pilonidal area will refer to gen surgery.  3:25 AM:  I have discussed the diagnosis/risks/treatment options with the patient and believe the pt to be eligible for discharge home to follow-up with PCP. We also discussed returning to the ED immediately if new or worsening sx occur. We discussed the sx which are most concerning (e.g., sudden worsening pain, fever, inability to tolerate by mouth) that necessitate immediate return. Medications administered to the patient during their visit and any new prescriptions provided to the patient are listed below.  Medications given during this visit Medications  lidocaine-EPINEPHrine (XYLOCAINE W/EPI) 2 %-1:200000 (PF) injection 10 mL (10 mLs Intradermal Given by Other 02/20/21 0243)  acetaminophen (TYLENOL) tablet 1,000 mg (1,000 mg Oral Given 02/20/21 0243)  oxyCODONE (Oxy IR/ROXICODONE) immediate release tablet 5 mg (5 mg Oral Given 02/20/21 0243)  sulfamethoxazole-trimethoprim (BACTRIM DS) 800-160 MG per tablet 1 tablet (1 tablet Oral Given  02/20/21 0318)     The patient appears reasonably screen and/or stabilized for discharge and I doubt any other medical condition or other [facility] requiring further screening, evaluation, or treatment in the ED at this time prior to discharge.    Final Clinical Impression(s) / ED Diagnoses Final diagnoses:  Abscess    Rx / DC Orders ED Discharge Orders          Ordered    sulfamethoxazole-trimethoprim (BACTRIM DS) 800-160 MG tablet  2 times daily        02/20/21 0313    morphine (MSIR) 15 MG tablet  Every 4 hours PRN        02/20/21 0314             Melene Plan, DO 02/20/21 [phone]

## 2021-02-20 NOTE — ED Triage Notes (Signed)
Patient here POV from Home with Abscess.   Patient states it began a few days PTA and had become more painful. Abscess is approximately 4 cm and is located at the Top of the Right Buttock.    BIB Stretcher. A&Ox4. GCS 15.

## 2021-02-20 NOTE — Discharge Instructions (Addendum)
Warm compresses at least 4 times a day.  Please follow-up with the general Montag in the office.  Return for rapid spreading redness or fever.  Take 4 over the counter ibuprofen tablets 3 times a day or 2 over-the-counter naproxen tablets twice a day for pain. Also take tylenol 1000mg (2 extra strength) four times a day.   Then take the pain medicine if you feel like you need it. Narcotics do not help with the pain, they only make you care about it less.  You can become addicted to this, people may break into your house to steal it.  It will constipate you.  If you drive under the influence of this medicine you can get a DUI.

## 2022-03-03 ENCOUNTER — Emergency Department (HOSPITAL_COMMUNITY): Payer: Self-pay | Admitting: Anesthesiology

## 2022-03-03 ENCOUNTER — Ambulatory Visit (HOSPITAL_COMMUNITY)
Admission: EM | Admit: 2022-03-03 | Discharge: 2022-03-03 | Disposition: A | Discharge status: Home / Self Care | Payer: Self-pay | Attending: Emergency Medicine | Admitting: Emergency Medicine

## 2022-03-03 ENCOUNTER — Encounter (HOSPITAL_COMMUNITY): Payer: Self-pay

## 2022-03-03 ENCOUNTER — Emergency Department (HOSPITAL_BASED_OUTPATIENT_CLINIC_OR_DEPARTMENT_OTHER): Payer: Self-pay | Admitting: Anesthesiology

## 2022-03-03 ENCOUNTER — Emergency Department (HOSPITAL_COMMUNITY): Payer: Self-pay

## 2022-03-03 ENCOUNTER — Encounter (HOSPITAL_COMMUNITY)
Admission: EM | Disposition: A | Discharge status: Home / Self Care | Payer: Self-pay | Source: Home / Self Care | Attending: Emergency Medicine

## 2022-03-03 DIAGNOSIS — F17210 Nicotine dependence, cigarettes, uncomplicated: Secondary | ICD-10-CM | POA: Insufficient documentation

## 2022-03-03 DIAGNOSIS — N4400 Torsion of testis, unspecified: Secondary | ICD-10-CM

## 2022-03-03 DIAGNOSIS — Z6831 Body mass index (BMI) 31.0-31.9, adult: Secondary | ICD-10-CM | POA: Insufficient documentation

## 2022-03-03 DIAGNOSIS — I10 Essential (primary) hypertension: Secondary | ICD-10-CM | POA: Insufficient documentation

## 2022-03-03 DIAGNOSIS — E669 Obesity, unspecified: Secondary | ICD-10-CM | POA: Insufficient documentation

## 2022-03-03 HISTORY — PX: ORCHIOPEXY: SHX479

## 2022-03-03 LAB — COMPREHENSIVE METABOLIC PANEL
ALT: 26 U/L (ref 0–44)
AST: 23 U/L (ref 15–41)
Albumin: 3.8 g/dL (ref 3.5–5.0)
Alkaline Phosphatase: 68 U/L (ref 38–126)
Anion gap: 12 (ref 5–15)
BUN: 11 mg/dL (ref 6–20)
CO2: 20 mmol/L — ABNORMAL LOW (ref 22–32)
Calcium: 9.1 mg/dL (ref 8.9–10.3)
Chloride: 105 mmol/L (ref 98–111)
Creatinine, Ser: 1.3 mg/dL — ABNORMAL HIGH (ref 0.61–1.24)
GFR, Estimated: >60 mL/min (ref >60)
Glucose, Bld: 139 mg/dL — ABNORMAL HIGH (ref 70–99)
Potassium: 3 mmol/L — ABNORMAL LOW (ref 3.5–5.1)
Sodium: 137 mmol/L (ref 135–145)
Total Bilirubin: 0.7 mg/dL (ref 0.3–1.2)
Total Protein: 7.5 g/dL (ref 6.5–8.1)

## 2022-03-03 LAB — CBC WITH DIFFERENTIAL/PLATELET
Abs Immature Granulocytes: 0.04 10*3/uL (ref 0.00–0.07)
Basophils Absolute: 0.1 10*3/uL (ref 0.0–0.1)
Basophils Relative: 1 %
Eosinophils Absolute: 0.3 10*3/uL (ref 0.0–0.5)
Eosinophils Relative: 3 %
HCT: 47.6 % (ref 39.0–52.0)
Hemoglobin: 15.9 g/dL (ref 13.0–17.0)
Immature Granulocytes: 0 %
Lymphocytes Relative: 32 %
Lymphs Abs: 2.9 10*3/uL (ref 0.7–4.0)
MCH: 31.2 pg (ref 26.0–34.0)
MCHC: 33.4 g/dL (ref 30.0–36.0)
MCV: 93.3 fL (ref 80.0–100.0)
Monocytes Absolute: 1 10*3/uL (ref 0.1–1.0)
Monocytes Relative: 11 %
Neutro Abs: 4.8 10*3/uL (ref 1.7–7.7)
Neutrophils Relative %: 53 %
Platelets: 282 10*3/uL (ref 150–400)
RBC: 5.1 MIL/uL (ref 4.22–5.81)
RDW: 13.1 % (ref 11.5–15.5)
WBC: 9.2 10*3/uL (ref 4.0–10.5)
nRBC: 0 % (ref 0.0–0.2)

## 2022-03-03 LAB — LIPASE, BLOOD: Lipase: 44 U/L (ref 11–51)

## 2022-03-03 SURGERY — ORCHIOPEXY ADULT
Anesthesia: General | Site: Scrotum | Laterality: Bilateral

## 2022-03-03 MED ORDER — CHLORHEXIDINE GLUCONATE 0.12 % MT SOLN
15.0000 mL | Freq: Once | OROMUCOSAL | Status: AC
  Administered 2022-03-03: 15 mL via OROMUCOSAL
  Filled 2022-03-03: qty 15

## 2022-03-03 MED ORDER — HYDROMORPHONE HCL 1 MG/ML IJ SOLN: 0.2500 mg | INTRAMUSCULAR | Status: DC | PRN

## 2022-03-03 MED ORDER — 0.9 % SODIUM CHLORIDE (POUR BTL) OPTIME
TOPICAL | Status: DC | PRN
  Administered 2022-03-03: 1000 mL

## 2022-03-03 MED ORDER — DEXAMETHASONE SODIUM PHOSPHATE 10 MG/ML IJ SOLN
INTRAMUSCULAR | Status: DC | PRN
  Administered 2022-03-03: 5 mg via INTRAVENOUS

## 2022-03-03 MED ORDER — MIDAZOLAM HCL 2 MG/2ML IJ SOLN
INTRAMUSCULAR | Status: AC
  Filled 2022-03-03: qty 2

## 2022-03-03 MED ORDER — CEFAZOLIN SODIUM-DEXTROSE 2-3 GM-%(50ML) IV SOLR
INTRAVENOUS | Status: DC | PRN
  Administered 2022-03-03: 2 g via INTRAVENOUS

## 2022-03-03 MED ORDER — LIDOCAINE 2% (20 MG/ML) 5 ML SYRINGE
INTRAMUSCULAR | Status: DC | PRN
  Administered 2022-03-03: 60 mg via INTRAVENOUS

## 2022-03-03 MED ORDER — VASOPRESSIN 20 UNIT/ML IV SOLN
INTRAVENOUS | Status: AC
  Filled 2022-03-03: qty 1

## 2022-03-03 MED ORDER — FENTANYL CITRATE (PF) 250 MCG/5ML IJ SOLN
INTRAMUSCULAR | Status: AC
  Filled 2022-03-03: qty 5

## 2022-03-03 MED ORDER — MIDAZOLAM HCL 2 MG/2ML IJ SOLN
INTRAMUSCULAR | Status: DC | PRN
  Administered 2022-03-03: 2 mg via INTRAVENOUS

## 2022-03-03 MED ORDER — HYDROMORPHONE HCL 1 MG/ML IJ SOLN
1.0000 mg | Freq: Once | INTRAMUSCULAR | Status: AC
  Administered 2022-03-03: 1 mg via INTRAVENOUS
  Filled 2022-03-03: qty 1

## 2022-03-03 MED ORDER — BUPIVACAINE HCL (PF) 0.25 % IJ SOLN
INTRAMUSCULAR | Status: AC
Start: 2022-03-03 — End: ?
  Filled 2022-03-03: qty 60

## 2022-03-03 MED ORDER — KETOROLAC TROMETHAMINE 30 MG/ML IJ SOLN
INTRAMUSCULAR | Status: DC | PRN
  Administered 2022-03-03: 30 mg via INTRAVENOUS

## 2022-03-03 MED ORDER — ACETAMINOPHEN 10 MG/ML IV SOLN
INTRAVENOUS | Status: AC
  Filled 2022-03-03: qty 100

## 2022-03-03 MED ORDER — LACTATED RINGERS IV SOLN: INTRAVENOUS | Status: DC

## 2022-03-03 MED ORDER — ONDANSETRON HCL 4 MG/2ML IJ SOLN
4.0000 mg | Freq: Once | INTRAMUSCULAR | Status: AC
  Administered 2022-03-03: 4 mg via INTRAVENOUS
  Filled 2022-03-03: qty 2

## 2022-03-03 MED ORDER — LIDOCAINE 2% (20 MG/ML) 5 ML SYRINGE
INTRAMUSCULAR | Status: AC
  Filled 2022-03-03: qty 5

## 2022-03-03 MED ORDER — FENTANYL CITRATE (PF) 250 MCG/5ML IJ SOLN
INTRAMUSCULAR | Status: DC | PRN
Start: 2022-03-03 — End: 2022-03-03
  Administered 2022-03-03 (×3): 50 ug via INTRAVENOUS

## 2022-03-03 MED ORDER — PROPOFOL 10 MG/ML IV BOLUS
INTRAVENOUS | Status: DC | PRN
  Administered 2022-03-03: 200 mg via INTRAVENOUS

## 2022-03-03 MED ORDER — OXYCODONE HCL 5 MG PO TABS
5.0000 mg | ORAL_TABLET | ORAL | 0 refills | Status: AC | PRN
Start: 2022-03-03 — End: ?

## 2022-03-03 MED ORDER — HYDRALAZINE HCL 20 MG/ML IJ SOLN
5.0000 mg | Freq: Once | INTRAMUSCULAR | Status: AC
  Administered 2022-03-03: 5 mg via INTRAVENOUS

## 2022-03-03 MED ORDER — HYDRALAZINE HCL 20 MG/ML IJ SOLN
INTRAMUSCULAR | Status: AC
  Filled 2022-03-03: qty 1

## 2022-03-03 MED ORDER — ORAL CARE MOUTH RINSE: 15.0000 mL | Freq: Once | OROMUCOSAL | Status: AC

## 2022-03-03 MED ORDER — POTASSIUM CHLORIDE 10 MEQ/100ML IV SOLN: 10.0000 meq | INTRAVENOUS | Status: DC

## 2022-03-03 MED ORDER — DEXMEDETOMIDINE (PRECEDEX) IN NS 20 MCG/5ML (4 MCG/ML) IV SYRINGE
PREFILLED_SYRINGE | INTRAVENOUS | Status: DC | PRN
  Administered 2022-03-03 (×2): 8 ug via INTRAVENOUS

## 2022-03-03 MED ORDER — PROPOFOL 10 MG/ML IV BOLUS
INTRAVENOUS | Status: AC
  Filled 2022-03-03: qty 20

## 2022-03-03 MED ORDER — ONDANSETRON HCL 4 MG/2ML IJ SOLN
INTRAMUSCULAR | Status: DC | PRN
  Administered 2022-03-03: 4 mg via INTRAVENOUS

## 2022-03-03 MED ORDER — ACETAMINOPHEN 10 MG/ML IV SOLN
INTRAVENOUS | Status: DC | PRN
  Administered 2022-03-03: 1000 mg via INTRAVENOUS

## 2022-03-03 MED ORDER — SODIUM CHLORIDE 0.9 % IV BOLUS
1000.0000 mL | Freq: Once | INTRAVENOUS | Status: AC
  Administered 2022-03-03: 1000 mL via INTRAVENOUS

## 2022-03-03 MED ORDER — ONDANSETRON HCL 4 MG/2ML IJ SOLN
INTRAMUSCULAR | Status: AC
  Filled 2022-03-03: qty 2

## 2022-03-03 MED ORDER — BUPIVACAINE HCL (PF) 0.25 % IJ SOLN
INTRAMUSCULAR | Status: DC | PRN
  Administered 2022-03-03: 10 mL

## 2022-03-03 MED ORDER — DEXAMETHASONE SODIUM PHOSPHATE 10 MG/ML IJ SOLN
INTRAMUSCULAR | Status: AC
  Filled 2022-03-03: qty 1

## 2022-03-03 SURGICAL SUPPLY — 35 items
ADH SKN CLS APL DERMABOND .7 (GAUZE/BANDAGES/DRESSINGS) ×1
BAG COUNTER SPONGE SURGICOUNT (BAG) IMPLANT
BAG SPNG CNTER NS LX DISP (BAG)
BLADE CLIPPER SURG (BLADE) ×1 IMPLANT
BLADE HEX COATED 2.75 (ELECTRODE) ×1 IMPLANT
BNDG GAUZE DERMACEA FLUFF 4 (GAUZE/BANDAGES/DRESSINGS) ×1 IMPLANT
BNDG GZE DERMACEA 4 6PLY (GAUZE/BANDAGES/DRESSINGS) ×1
COVER MAYO STAND STRL (DRAPES) IMPLANT
COVER SURGICAL LIGHT HANDLE (MISCELLANEOUS) ×1 IMPLANT
DERMABOND ADVANCED (GAUZE/BANDAGES/DRESSINGS) ×1
DERMABOND ADVANCED .7 DNX12 (GAUZE/BANDAGES/DRESSINGS) ×1 IMPLANT
DRAIN PENROSE 0.25X18 (DRAIN) IMPLANT
DRAPE LAPAROTOMY 100X72 PEDS (DRAPES) ×1 IMPLANT
ELECT REM PT RETURN 15FT ADLT (MISCELLANEOUS) ×1 IMPLANT
GAUZE 4X4 16PLY ~~LOC~~+RFID DBL (SPONGE) IMPLANT
GLOVE BIO SURGEON STRL SZ7.5 (GLOVE) ×2 IMPLANT
GLOVE BIOGEL PI IND STRL 6.5 (GLOVE) IMPLANT
GLOVE BIOGEL PI INDICATOR 6.5 (GLOVE) ×1
GOWN STRL REUS W/ TWL LRG LVL3 (GOWN DISPOSABLE) ×1 IMPLANT
GOWN STRL REUS W/TWL LRG LVL3 (GOWN DISPOSABLE) ×1
KIT BASIN OR (CUSTOM PROCEDURE TRAY) ×1 IMPLANT
NDL HYPO 25GX1X1/2 BEV (NEEDLE) IMPLANT
NEEDLE HYPO 22GX1.5 SAFETY (NEEDLE) IMPLANT
NEEDLE HYPO 25GX1X1/2 BEV (NEEDLE) ×1 IMPLANT
NS IRRIG 1000ML POUR BTL (IV SOLUTION) ×1 IMPLANT
PACK GENERAL/GYN (CUSTOM PROCEDURE TRAY) ×1 IMPLANT
SOL PREP POV-IOD 4OZ 10% (MISCELLANEOUS) ×1 IMPLANT
SUPPORT SCROTAL LG STRP (MISCELLANEOUS) ×1 IMPLANT
SUT CHROMIC 3 0 SH 27 (SUTURE) ×2 IMPLANT
SUT PDS AB 4-0 SH 27 (SUTURE) IMPLANT
SUT VIC AB 2-0 UR5 27 (SUTURE) IMPLANT
SUT VICRYL 0 TIES 12 18 (SUTURE) IMPLANT
SYR CONTROL 10ML LL (SYRINGE) IMPLANT
TOWEL GREEN STERILE (TOWEL DISPOSABLE) ×2 IMPLANT
WATER STERILE IRR 1000ML POUR (IV SOLUTION) ×1 IMPLANT

## 2022-03-03 NOTE — Discharge Instructions (Addendum)
Discharge instructions following scrotal surgery  Call your doctor for: Fever is greater than 100.5 Severe nausea or vomiting Increasing pain not controlled by pain medication Increasing redness or drainage from incisions  The number for questions or concerns is [phone]  Activity level: No lifting greater than 20 pounds (about equal to milk) for the next 2 weeks or until cleared to do so at follow-up appointment.  Otherwise activity as tolerated by comfort level.  Diet: May resume your regular diet as tolerated  Driving: No driving while still taking opiate pain medications (weight at least 6-8 hours after last dose).  No driving if you still sore from surgery as it may limit her ability to react quickly if necessary.   Shower/bath: May shower and get incision wet pad dry immediately following.  Do not scrub vigorously for the next 2-3 weeks.  Do not soak incision (ID soaking in bath or swimming) until told he may do so by Dr., as this may promote a wound infection.  Wound care: He may cover wounds with sterile gauze as needed to prevent incisions rubbing on close follow-up in any seepage.  Where tight fitting underpants/scrotal support for at least 2 weeks.  He should apply cold compresses (ice or sac of frozen peas/corn) to your scrotum for at least 48 hours to reduce the swelling for 15 minutes at a time indirectly.  You should expect that his scrotum will swell up initially and then get smaller over the next 2-4 weeks.  Follow-up appointments: Follow-up appointment will be scheduled with Dr. Bell for a wound check.  

## 2022-03-03 NOTE — Anesthesia Preprocedure Evaluation (Addendum)
Anesthesia Evaluation  Patient identified by MRN, date of birth, ID band Patient awake    Reviewed: Allergy & Precautions, H&P , NPO status , Patient's Chart, lab work & pertinent test results  Airway Mallampati: III  TM Distance: >3 FB Neck ROM: Full    Dental no notable dental hx. (+) Teeth Intact, Dental Advisory Given   Pulmonary Current Smoker and Patient abstained from smoking.,    Pulmonary exam normal breath sounds clear to auscultation       Cardiovascular hypertension, Normal cardiovascular exam Rhythm:Regular Rate:Normal     Neuro/Psych negative neurological ROS  negative psych ROS   GI/Hepatic negative GI ROS, Neg liver ROS,   Endo/Other  Obesity   Renal/GU negative Renal ROS   L testicular torsion negative genitourinary   Musculoskeletal   Abdominal   Peds  Hematology negative hematology ROS (+)   Anesthesia Other Findings   Reproductive/Obstetrics negative OB ROS                            Anesthesia Physical Anesthesia Plan  ASA: 2 and emergent  Anesthesia Plan: General   Post-op Pain Management: Toradol IV (intra-op)* and Ofirmev IV (intra-op)*   Induction: Intravenous, Rapid sequence and Cricoid pressure planned  PONV Risk Score and Plan: 2 and Ondansetron, Dexamethasone and Midazolam  Airway Management Planned: Oral ETT  Additional Equipment:   Intra-op Plan:   Post-operative Plan: Extubation in OR  Informed Consent: I have reviewed the patients History and Physical, chart, labs and discussed the procedure including the risks, benefits and alternatives for the proposed anesthesia with the patient or authorized representative who has indicated his/her understanding and acceptance.     Dental advisory given  Plan Discussed with: CRNA  Anesthesia Plan Comments:        Anesthesia Quick Evaluation

## 2022-03-03 NOTE — Consult Note (Signed)
.H&P Physician requesting consult: Benjiman Core  Chief Complaint: Left testicular torsion  History of Present Illness: 31 year old male brought in by EMS for left-sided testicular pain.  Acute in onset this morning.  Awoke him from sleep.  Scrotal ultrasound was performed that showed left testicular torsion.  No past medical history on file. Past Surgical History:  Procedure Laterality Date   ELBOW SURGERY      Home Medications:  (Not in a hospital admission)  Allergies: No Known Allergies  Family History  Problem Relation Age of Onset   Healthy Mother    Healthy Father    Social History:  reports that he has been smoking cigarettes. He has been smoking an average of .1 packs per day. He has never used smokeless tobacco. He reports that he does not drink alcohol and does not use drugs.  ROS: A complete review of systems was performed.  All systems are negative except for pertinent findings as noted. ROS   Physical Exam:  Vital signs in last 24 hours: Temp:  [97.8 F (36.6 C)] 97.8 F (36.6 C) (08/29 0639) Pulse Rate:  [56] 56 (08/29 0806) Resp:  [15] 15 (08/29 0806) BP: (172)/(116) 172/116 (08/29 0806) SpO2:  [98 %-100 %] 100 % (08/29 0806) General:  Alert and oriented, No acute distress HEENT: Normocephalic, atraumatic Neck: No JVD or lymphadenopathy Cardiovascular: Regular rate and rhythm Lungs: Regular rate and effort Abdomen: Soft, nontender, nondistended, no abdominal masses Back: No CVA tenderness Genitourinary: Circumcised phallus.  Right testicle descended without masses.  Left testicle difficult to examine secondary to patient discomfort.  Had some pain with palpation. Extremities: No edema Neurologic: Grossly intact  Laboratory Data:  Results for orders placed or performed during the hospital encounter of 03/03/22 (from the past 24 hour(s))  CBC with Differential     Status: None   Collection Time: 03/03/22  6:49 AM  Result Value Ref Range   WBC  9.2 4.0 - 10.5 K/uL   RBC 5.10 4.22 - 5.81 MIL/uL   Hemoglobin 15.9 13.0 - 17.0 g/dL   HCT 16.1 09.6 - 04.5 %   MCV 93.3 80.0 - 100.0 fL   MCH 31.2 26.0 - 34.0 pg   MCHC 33.4 30.0 - 36.0 g/dL   RDW 40.9 81.1 - 91.4 %   Platelets 282 150 - 400 K/uL   nRBC 0.0 0.0 - 0.2 %   Neutrophils Relative % 53 %   Neutro Abs 4.8 1.7 - 7.7 K/uL   Lymphocytes Relative 32 %   Lymphs Abs 2.9 0.7 - 4.0 K/uL   Monocytes Relative 11 %   Monocytes Absolute 1.0 0.1 - 1.0 K/uL   Eosinophils Relative 3 %   Eosinophils Absolute 0.3 0.0 - 0.5 K/uL   Basophils Relative 1 %   Basophils Absolute 0.1 0.0 - 0.1 K/uL   Immature Granulocytes 0 %   Abs Immature Granulocytes 0.04 0.00 - 0.07 K/uL  Comprehensive metabolic panel     Status: Abnormal   Collection Time: 03/03/22  6:49 AM  Result Value Ref Range   Sodium 137 135 - 145 mmol/L   Potassium 3.0 (L) 3.5 - 5.1 mmol/L   Chloride 105 98 - 111 mmol/L   CO2 20 (L) 22 - 32 mmol/L   Glucose, Bld 139 (H) 70 - 99 mg/dL   BUN 11 6 - 20 mg/dL   Creatinine, Ser 7.82 (H) 0.61 - 1.24 mg/dL   Calcium 9.1 8.9 - 95.6 mg/dL   Total Protein 7.5 6.5 -  8.1 g/dL   Albumin 3.8 3.5 - 5.0 g/dL   AST 23 15 - 41 U/L   ALT 26 0 - 44 U/L   Alkaline Phosphatase 68 38 - 126 U/L   Total Bilirubin 0.7 0.3 - 1.2 mg/dL   GFR, Estimated >41 >28 mL/min   Anion gap 12 5 - 15  Lipase, blood     Status: None   Collection Time: 03/03/22  6:49 AM  Result Value Ref Range   Lipase 44 11 - 51 U/L   No results found for this or any previous visit (from the past 240 hour(s)). Creatinine: Recent Labs    03/03/22 0649  CREATININE 1.30*   Scrotal ultrasound personally reviewed and detailed in history of present illness.  Impression/Assessment:  Left testicular torsion  Plan:  Emergent scrotal exploration with bilateral orchiopexy, possible left orchiectomy.  Risk and benefits discussed including but not limited to bleeding including hematoma formation, infection, injury to surrounding  structures, need for additional procedures.  Consent signed.  Ray Church, III 03/03/2022, 8:57 AM

## 2022-03-03 NOTE — ED Triage Notes (Signed)
BIB [facility] from home. C/o lt testicle and lt lower abd pain. Started this morning. Nausea no vomiting, no urinary symptoms. PIV 18ga lt ac.

## 2022-03-03 NOTE — Op Note (Signed)
Operative Note  Preoperative diagnosis:  1.  Left testicular torsion  Postoperative diagnosis: 1.  Left testicular torsion  Procedure(s): 1.  Bilateral scrotal expiration with bilateral orchiopexy  Schrieber: Modena Slater, MD  Assistants: None  Anesthesia: General  Complications: None immediate  EBL: Minimal  Specimens: 1.  None  Drains/Catheters: 1.  None  Intraoperative findings: Left testicular torsion  Indication: 31 year old male with left testicular torsion presents for the previously mentioned operation emergently.  Description of procedure:  The patient was identified and consent was obtained.  The patient was taken to the operating room and placed in the supine position.  The patient was placed under general anesthesia.  Perioperative antibiotics were administered.  Patient was prepped and draped in a standard sterile fashion and a timeout was performed.  An approximately 4 cm scrotal incision was made along the median raphae sharply.  This was carried down through the dartos and the left testicle was delivered onto the operative field.  The testicle was torsed.  I detorsed it.  It appeared viable but was blue.  I ablated the left appendix testis.  I wrapped the testicle and a moist Ray-Tec and set it aside.  Spot electrocautery was used for hemostasis.  I incised into the right dartos and deliver the right testicle onto the operative field.  Right testicle was normal in appearance.  I ablated the right appendix testis.  I used a 4-0 PDS suture to perform a two-point fixation.  I inserted a stitch in the median dartos tissue in the superior testicle and another 1 in the inferior testicle.  I inserted the testicle back into the scrotum in its proper anatomical position and then secured down the stitches bilaterally.  There is no evidence of any bleeding.  I inspected the left testicle.  It had pinked up and appeared viable.  I therefore performed the same procedure on the  left.  I delivered the testicle back into the scrotum in its proper anatomical position and secured down the stitches.  I then closed the dartos with a running 2-0 Vicryl.  I then closed the skin with a running 3-0 chromic.  Quarter percent Marcaine was instilled for anesthetic effect.  Dermabond was applied.  This include the operation.  Patient tolerated the procedure well and was stable postoperative.  Plan: Follow-up in 4 to 6 weeks for postoperative check.

## 2022-03-03 NOTE — ED Provider Notes (Signed)
[facility] EMERGENCY DEPARTMENT Provider Note   CSN: [id number] Arrival date & time: 03/03/22  0636     History  Chief Complaint  Patient presents with   Testicle Pain    Troy Dickerson is a 31 y.o. male.  The history is provided by the patient and the EMS personnel. No language interpreter was used.  Testicle Pain     31 year old male brought here via EMS from home with complaints of testicular pain.  Per EMS, patient reported cute onset of pain to his left testicle that radiates towards his left lower abdomen that started this morning.  He felt fine last night when he went to sleep.  Pain is intense sharp stabbing and persistent.  Patient endorsed nausea without any vomiting.  While in the EMS ambulance, patient was diaphoretic therefore EMS obtained an EKG.  Patient does not endorse any chest pain or shortness of breath he denies having fever or chills.  He denies having urinary symptoms no hematuria no penile discharge and no trauma.  No history of kidney stone.  Home Medications Prior to Admission medications   Medication Sig Start Date End Date Taking? Authorizing Provider  amoxicillin-clavulanate (AUGMENTIN) 875-125 MG tablet Take 1 tablet by mouth every 12 (twelve) hours. 12/06/19   Lamptey, Britta Mccreedy, MD  diclofenac (VOLTAREN) 75 MG EC tablet Take 1 tablet (75 mg total) by mouth 2 (two) times daily. 08/24/12   Ivery Quale, PA-C  doxycycline (VIBRAMYCIN) 100 MG capsule Take 1 capsule (100 mg total) by mouth 2 (two) times daily. 12/22/19   Darr, Gerilyn Pilgrim, PA-C  ibuprofen (ADVIL) 600 MG tablet Take 1 tablet (600 mg total) by mouth every 6 (six) hours as needed. 03/30/20   Mickie Bail, NP  morphine (MSIR) 15 MG tablet Take 0.5 tablets (7.5 mg total) by mouth every 4 (four) hours as needed for severe pain. 02/20/21   Melene Plan, DO  naproxen sodium (ANAPROX) 220 MG tablet Take 440 mg by mouth 2 (two) times daily with a meal.    [provider]       Allergies    Patient has no known allergies.    Review of Systems   Review of Systems  Genitourinary:  Positive for testicular pain.  All other systems reviewed and are negative.   Physical Exam Updated Vital Signs BP (!) 172/116 (BP Location: Left Arm)   Pulse (!) 56   Temp 97.8 F (36.6 C) (Oral)   Resp 15   SpO2 100%  Physical Exam Vitals and nursing note reviewed.  Constitutional:      General: He is in acute distress.     Appearance: He is well-developed.     Comments: Patient appears very uncomfortable  HENT:     Head: Atraumatic.     Mouth/Throat:     Mouth: Mucous membranes are dry.  Eyes:     Conjunctiva/sclera: Conjunctivae normal.  Cardiovascular:     Rate and Rhythm: Normal rate and regular rhythm.     Pulses: Normal pulses.     Heart sounds: Normal heart sounds.  Pulmonary:     Effort: Pulmonary effort is normal.     Breath sounds: Normal breath sounds.  Abdominal:     Palpations: Abdomen is soft.     Tenderness: There is no abdominal tenderness. There is no left CVA tenderness.  Genitourinary:    Comments: Chaperone present during exam.  Normal circumcised penis free of lesion or rash.  Left testicle with tenderness  to palpation with limited exam due to patient discomfort.  No obvious scrotal swelling noted.  No inguinal lymphadenopathy or inguinal hernia noted. Musculoskeletal:     Cervical back: Neck supple.  Skin:    Findings: No rash.  Neurological:     Mental Status: He is alert.     ED Results / Procedures / Treatments   Labs (all labs ordered are listed, but only abnormal results are displayed) Labs Reviewed  COMPREHENSIVE METABOLIC PANEL - Abnormal; Notable for the following components:      Result Value   Potassium 3.0 (*)    CO2 20 (*)    Glucose, Bld 139 (*)    Creatinine, Ser 1.30 (*)    All other components within normal limits  CBC WITH DIFFERENTIAL/PLATELET  LIPASE, BLOOD  URINALYSIS, ROUTINE W REFLEX MICROSCOPIC     EKG None  Radiology No results found.  Procedures .Critical Care  Performed by: Fayrene Helper, PA-C Authorized by: Fayrene Helper, PA-C   Critical care provider statement:    Critical care time (minutes):  30   Critical care was time spent personally by me on the following activities:  Development of treatment plan with patient or surrogate, discussions with consultants, evaluation of patient's response to treatment, examination of patient, ordering and review of laboratory studies, ordering and review of radiographic studies, ordering and performing treatments and interventions, pulse oximetry, re-evaluation of patient's condition and review of old charts     Medications Ordered in ED Medications  HYDROmorphone (DILAUDID) injection 1 mg (1 mg Intravenous Given 03/03/22 0647)  ondansetron (ZOFRAN) injection 4 mg (4 mg Intravenous Given 03/03/22 0646)  HYDROmorphone (DILAUDID) injection 1 mg (1 mg Intravenous Given 03/03/22 0806)  sodium chloride 0.9 % bolus 1,000 mL (1,000 mLs Intravenous New Bag/Given 03/03/22 0813)    ED Course/ Medical Decision Making/ A&P                           Medical Decision Making Amount and/or Complexity of Data Reviewed Labs: ordered. Radiology: ordered.  Risk Prescription drug management.   BP (!) 172/116 (BP Location: Left Arm)   Pulse (!) 56   Temp 97.8 F (36.6 C) (Oral)   Resp 15   SpO2 100%   6:44 AM This is a 31 year old male brought here via EMS from home with complaints of acute onset of left testicular pain.  Pain started this morning without any specific trauma.  Pain is intense radiates to his left lower abdomen.  Patient endorsed nausea without vomiting.  He does not endorse any fever or chills no dysuria hematuria penile discharge or any recent trauma.  He does not endorse any back pain he does not have any history of kidney stone.  EMS also noted that he was diaphoretic and appears uncomfortable in the EMS ambulance and an EKG was  obtained at that time.  He does not endorse any associate chest pain or shortness of breath.  On exam this is a well-appearing male appears to be very uncomfortable.  He does not have any CVA tenderness however he does have tenderness about his left testicle on palpation.  There is guarding and I am unable to fully examine his testicle.  No concerning rash noted.  No obvious inguinal hernia appreciated.  Abdomen otherwise soft.  No abdominal bruit.  Plan to provide symptomatic treatment which includes Dilaudid and Zofran.  Will obtain scrotal ultrasound to rule out testicular torsion.  Labs and UA  ordered.  Labs and imaging obtained independently visualized interpreted by me and I agree with radiologist interpretation.  Labs remarkable for mild AKI with creatinine of 1.3, ibuprofen given.  Potassium is 3.0, supplementation ordered.  Normal WBC, normal H&H.  8:14 AM Appreciate radiology for prompt notification that ultrasound result is consistent with left testicular torsion.  Patient made aware of finding.  Additional opiate pain medication given for better pain control.  8:29 AM Appreciate consultation from on-call urologist Dr. Alvester Morin who will evaluate patient and likely initiate surgical intervention.  Patient made aware of plan and agrees with plan.  This patient presents to the ED for concern of testicular pain, this involves an extensive number of treatment options, and is a complaint that carries with it a high risk of complications and morbidity.  The differential diagnosis includes testicular torsion, kidney stone, epididymitis, orchitis, scrotal abscess, UTI, trauma  Co morbidities that complicate the patient evaluation none Additional history obtained:  Additional history obtained from EMS External records from outside source obtained and reviewed including EMR including prior labs and imaging  Lab Tests:  I Ordered, and personally interpreted labs.  The pertinent results include:   as above  Imaging Studies ordered:  I ordered imaging studies including scrotal US I independently visualized and interpreted imaging which showed left testicular torsion I agree with the radiologist interpretation  Cardiac Monitoring:  The patient was maintained on a cardiac monitor.  I personally viewed and interpreted the cardiac monitored which showed an underlying rhythm of: sinus bradycardia  Medicines ordered and prescription drug management:  I ordered medication including dilaudid  for pain control Reevaluation of the patient after these medicines showed that the patient improved I have reviewed the patients home medicines and have made adjustments as needed  Test Considered: as above  Critical Interventions: IV opiate  Consultations Obtained:  I requested consultation with the urologist Dr. Alvester Morin,  and discussed lab and imaging findings as well as pertinent plan - they recommend: OR for surgical intervention  Problem List / ED Course: L testicular torsion  Reevaluation:  After the interventions noted above, I reevaluated the patient and found that they have :improved  Social Determinants of Health: tobacco use, recommend tobacco sessation  Dispostion:  After consideration of the diagnostic results and the patients response to treatment, I feel that the patent would benefit from admission.         Final Clinical Impression(s) / ED Diagnoses Final diagnoses:  Testicular torsion    Rx / DC Orders ED Discharge Orders     None         Fayrene Helper, PA-C 03/03/22 4268    Benjiman Core, MD 03/03/22 [phone]

## 2022-03-03 NOTE — Anesthesia Procedure Notes (Signed)
Procedure Name: LMA Insertion Date/Time: 03/03/2022 9:39 AM  Performed by: Aundria Rud, CRNAPre-anesthesia Checklist: Patient identified, Patient being monitored, Timeout performed, Emergency Drugs available and Suction available Patient Re-evaluated:Patient Re-evaluated prior to induction Oxygen Delivery Method: Circle system utilized Preoxygenation: Pre-oxygenation with 100% oxygen Induction Type: IV induction Ventilation: Mask ventilation without difficulty LMA: LMA inserted LMA Size: 5.0 Tube type: Oral Number of attempts: 1 Placement Confirmation: positive ETCO2 and breath sounds checked- equal and bilateral Tube secured with: Tape Dental Injury: Teeth and Oropharynx as per pre-operative assessment

## 2022-03-03 NOTE — Transfer of Care (Signed)
Immediate Anesthesia Transfer of Care Note  Patient: Troy Dickerson  Procedure(s) Performed: BILATERAL SCROTAL EXPLORATION AND ORCHIOPEXY (Bilateral: Scrotum)  Patient Location: PACU  Anesthesia Type:General  Level of Consciousness: drowsy, patient cooperative and responds to stimulation  Airway & Oxygen Therapy: Patient Spontanous Breathing and Patient connected to face mask oxygen  Post-op Assessment: Report given to RN, Post -op Vital signs reviewed and stable and Patient moving all extremities X 4  Post vital signs: Reviewed and stable  Last Vitals:  Vitals Value Taken Time  BP 157/125 03/03/22 1022  Temp    Pulse 69 03/03/22 1024  Resp 19 03/03/22 1024  SpO2 95 % 03/03/22 1024  Vitals shown include unvalidated device data.  Last Pain:  Vitals:   03/03/22 0919  TempSrc:   PainSc: 10-Worst pain ever         Complications: No notable events documented.

## 2022-03-04 ENCOUNTER — Encounter (HOSPITAL_COMMUNITY): Payer: Self-pay | Admitting: Urology

## 2022-03-05 NOTE — Anesthesia Postprocedure Evaluation (Signed)
Anesthesia Post Note  Patient: Troy Dickerson  Procedure(s) Performed: BILATERAL SCROTAL EXPLORATION AND ORCHIOPEXY (Bilateral: Scrotum)     Patient location during evaluation: PACU Anesthesia Type: General Level of consciousness: awake and alert Pain management: pain level controlled Vital Signs Assessment: post-procedure vital signs reviewed and stable Respiratory status: spontaneous breathing, nonlabored ventilation and respiratory function stable Cardiovascular status: blood pressure returned to baseline and stable Postop Assessment: no apparent nausea or vomiting Anesthetic complications: no   No notable events documented.  Last Vitals:  Vitals:   03/03/22 1040 03/03/22 1055  BP: (!) 145/89 (!) 140/87  Pulse: 90 77  Resp: 20 (!) 21  Temp:  36.8 C  SpO2: 99% 100%    Last Pain:  Vitals:   03/03/22 1055  TempSrc:   PainSc: 0-No pain                 Edmar Blankenburg,W. EDMOND

## 2022-07-09 IMAGING — MR MR KNEE*L* W/O CM
5 of 7 series · 22 of 40 positions shown · non-contrast
Comparison: None

CLINICAL DATA: Chronic knee pain from prior mass wall injury.
Generalized pain.

EXAM:
MRI OF THE LEFT KNEE WITHOUT CONTRAST
TECHNIQUE: Multiplanar, multisequence MR imaging of the knee was performed. No
intravenous contrast was administered.

[Series 5: T2 fat-sat · axial · 4.0mm · 0.50mm/px · z∈[-90,+25]mm · 6 of 24 slices shown (1 of 2)]
[im 1/24]
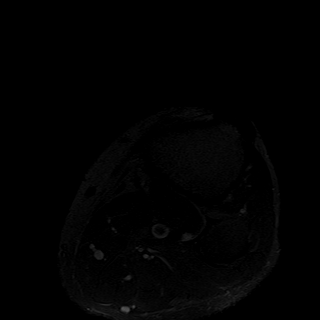
[im 5/24]
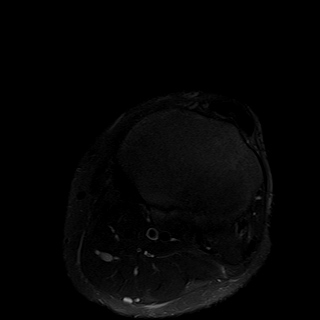
[im 10/24]
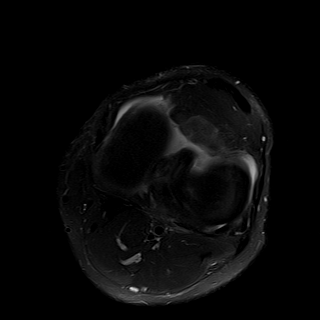
[im 14/24]
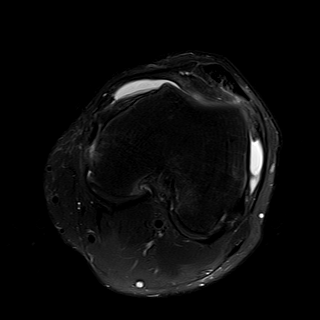
[im 19/24]
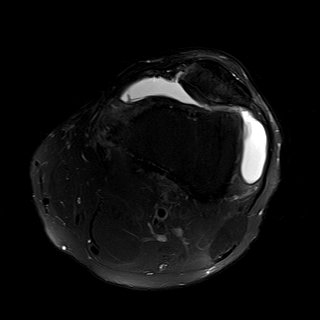
[im 24/24]
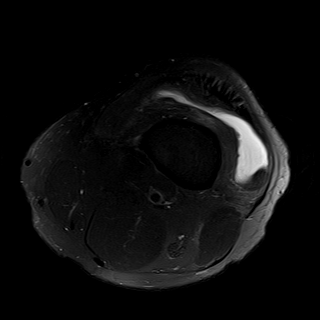

[Series 7: T2 fat-sat · coronal · 4.0mm · 0.29mm/px · 1 of 27 slices shown (2 of 2)]
[im 1/27]
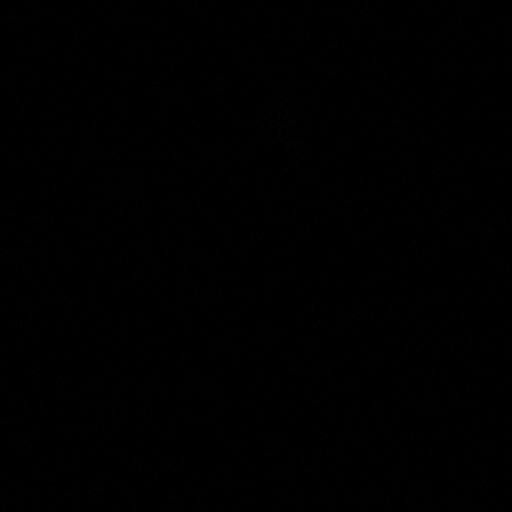

[Series 9: PD fat-sat · sagittal · 3.0mm · 0.29mm/px · 6 of 28 slices shown (1 of 3)]
[im 1/28]
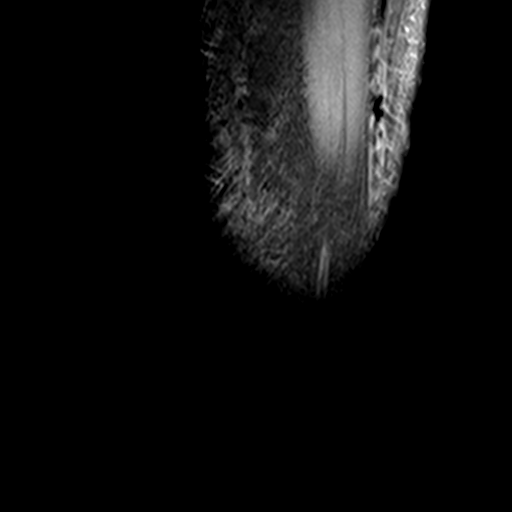
[im 6/28]
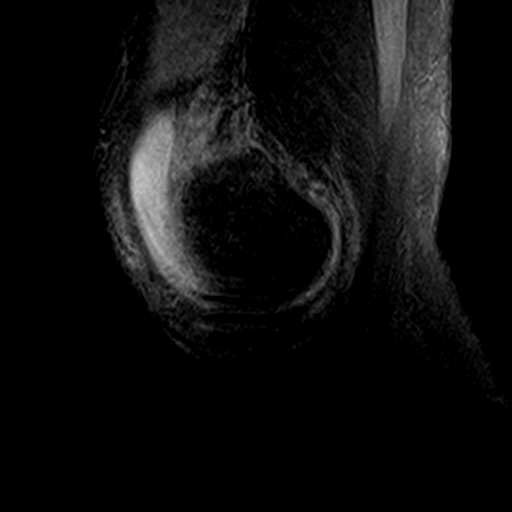
[im 11/28]
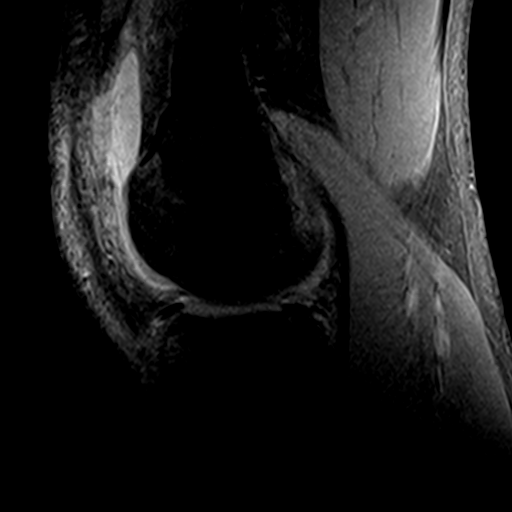
[im 17/28]
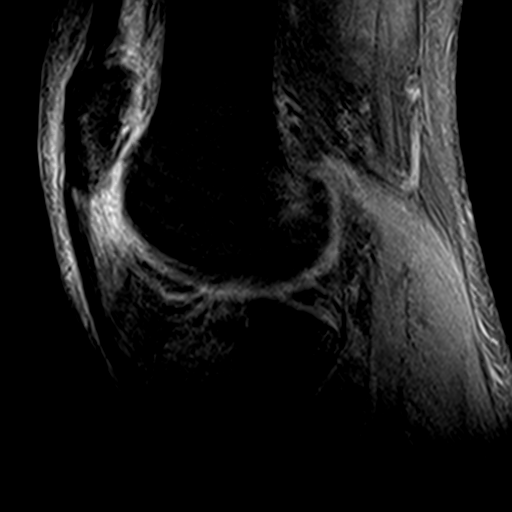
[im 22/28]
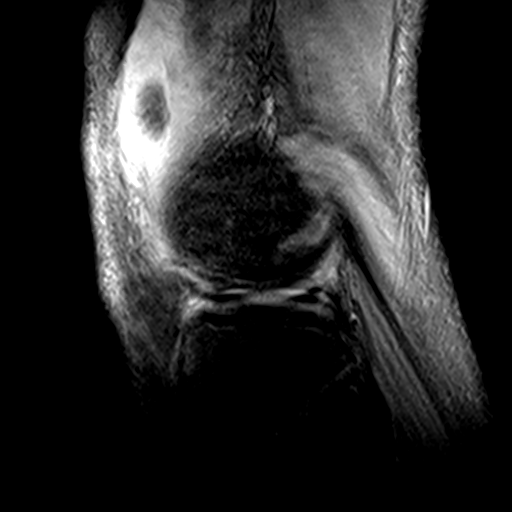
[im 28/28]
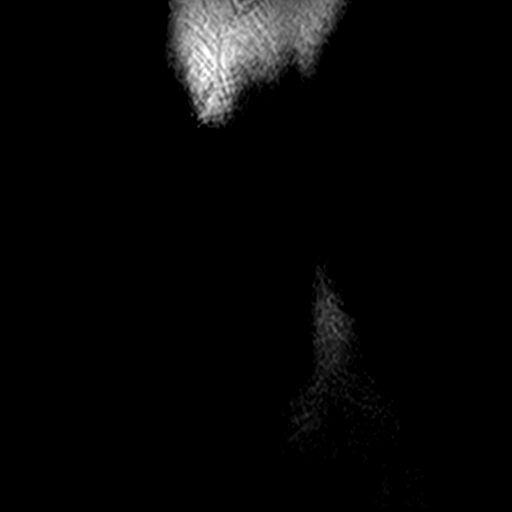

[Series 10: PD fat-sat · coronal · 3.0mm · 0.29mm/px · 7 of 32 slices shown (2 of 3)]
[im 1/32]
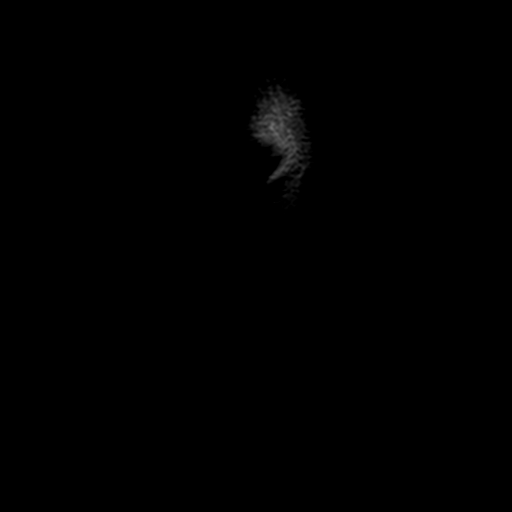
[im 6/32]
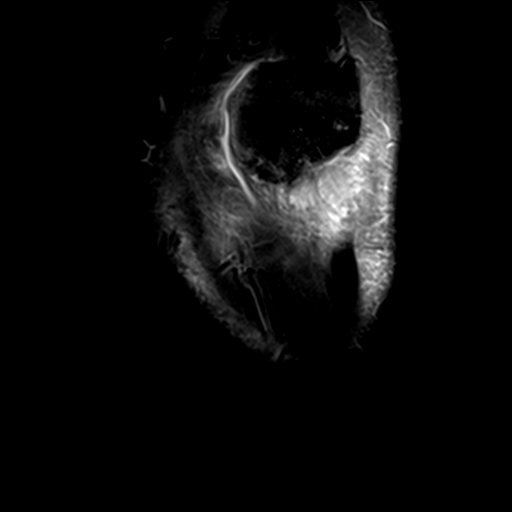
[im 11/32]
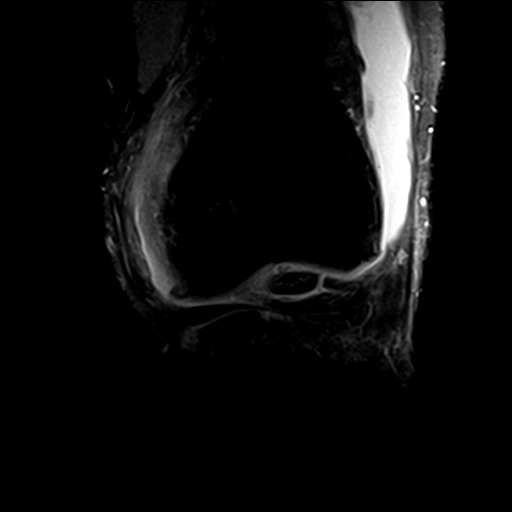
[im 16/32]
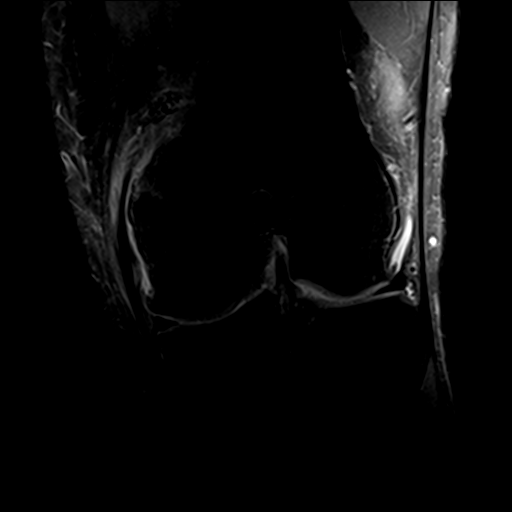
[im 21/32]
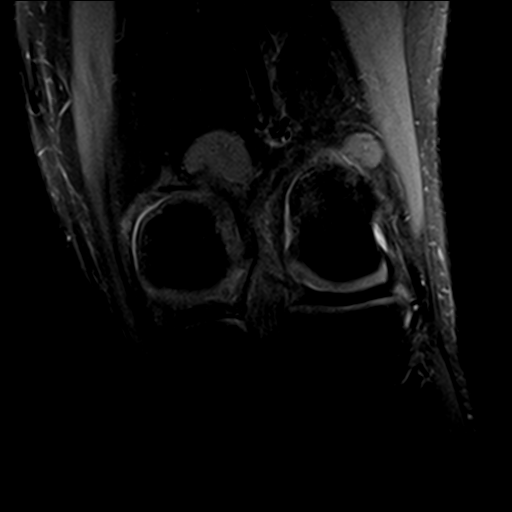
[im 26/32]
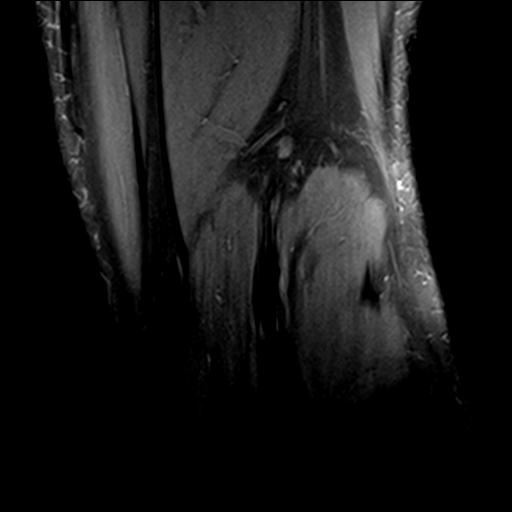
[im 32/32]
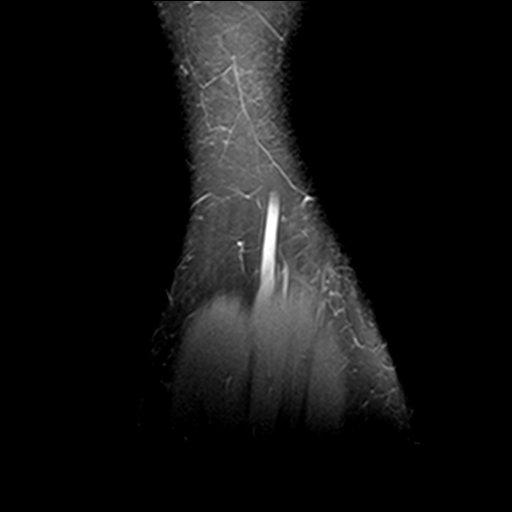

[Series 11: PD fat-sat · oblique · 2.3mm · 0.29mm/px · 2 of 11 slices shown (3 of 3)]
[im 1/11]
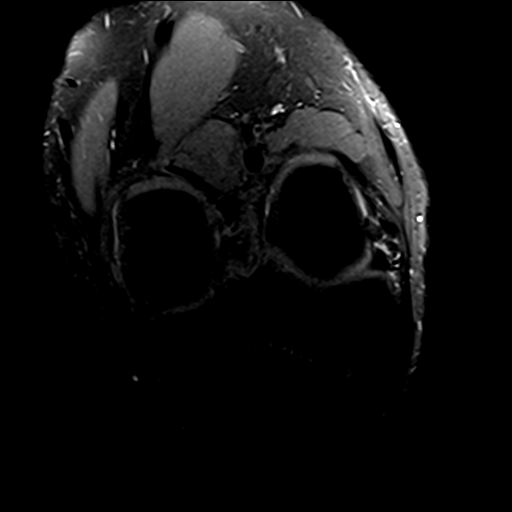
[im 11/11]
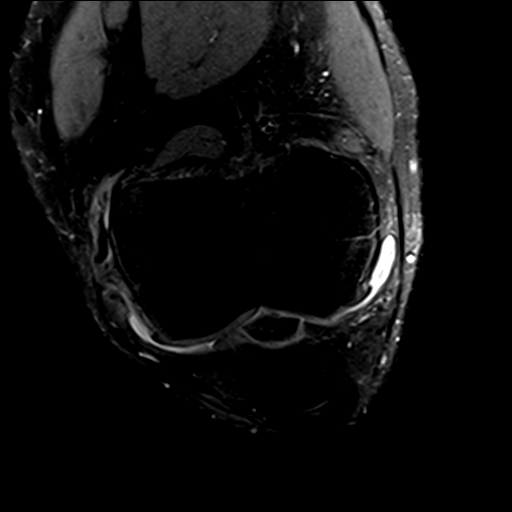

[22 of 40 positions shown; findings below may reference images not displayed]

FINDINGS: Patient motion degrades image quality limiting evaluation.

MENISCI

Medial: Intact.

Lateral: Intact.

LIGAMENTS

Cruciates: Intact ACL.  Intact PCL.

Collaterals: Medial collateral ligament is intact. Lateral
collateral ligament complex is intact.

CARTILAGE

Patellofemoral: High-grade partial-thickness cartilage loss with
areas of full-thickness cartilage loss of the lateral patellar facet
and trochlea.

Medial: Mild partial-thickness cartilage loss of the medial
femorotibial compartment.

Lateral: Partial thickness cartilage loss of the lateral femoral
condyle with small marginal osteophytes.

JOINT: Large joint effusion. Severe edema in superolateral Hoffa's
fat. No plical thickening.

POPLITEAL FOSSA: Popliteus tendon is intact. No Baker's cyst.

EXTENSOR MECHANISM: Intact quadriceps tendon. Intact patellar
tendon. Intact lateral patellar retinaculum. Intact medial patellar
retinaculum. Intact MPFL.

BONES: Bone marrow edema in the anterior proximal tibial metaphysis
without a linear component to suggest a fracture. Bone marrow edema
in the posterolateral femoral condyle and posteromedial femoral
condyle.

Other: No fluid collection or hematoma. Muscles are normal.
IMPRESSION: 1. Nonspecific marrow edema of the anterior proximal tibial
metaphysis, posterolateral femoral condyle and posteromedial femoral
condyle. Differential considerations include stress reaction versus
osseous contusions.
2. Tricompartmental cartilage abnormalities as described above.
3. Large joint effusion.
4. Severe edema in superolateral Hoffa's fat as can be seen with
patellar tendon-lateral femoral condyle friction syndrome.

## 2023-11-15 ENCOUNTER — Ambulatory Visit
Admission: EM | Admit: 2023-11-15 | Discharge: 2023-11-15 | Disposition: A | Discharge status: Home / Self Care | Payer: Self-pay | Attending: Family Medicine | Admitting: Family Medicine

## 2023-11-15 NOTE — ED Triage Notes (Signed)
 Pt c/o L ankle pain x6 days. States someone at work dropped a piece of steel on ankle. Unsure if WC.
